# Patient Record
Sex: Female | Born: 2000 | Race: White | Hispanic: No | Marital: Single | Smoking: Never smoker
Health system: Southern US, Community
[De-identification: ages and names within clinical notes are randomized; demographics above are authoritative.]

## PROBLEM LIST (undated history)

## (undated) DIAGNOSIS — E079 Disorder of thyroid, unspecified: Secondary | ICD-10-CM

## (undated) DIAGNOSIS — K219 Gastro-esophageal reflux disease without esophagitis: Secondary | ICD-10-CM

## (undated) DIAGNOSIS — F32A Depression, unspecified: Secondary | ICD-10-CM

## (undated) DIAGNOSIS — J358 Other chronic diseases of tonsils and adenoids: Secondary | ICD-10-CM

## (undated) DIAGNOSIS — J382 Nodules of vocal cords: Secondary | ICD-10-CM

## (undated) DIAGNOSIS — D649 Anemia, unspecified: Secondary | ICD-10-CM

## (undated) DIAGNOSIS — F419 Anxiety disorder, unspecified: Secondary | ICD-10-CM

## (undated) HISTORY — PX: CHOLECYSTECTOMY: SHX55

## (undated) HISTORY — DX: Anemia, unspecified: D64.9

## (undated) HISTORY — DX: Anxiety disorder, unspecified: F41.9

## (undated) HISTORY — PX: NO PAST SURGERIES: SHX2092

## (undated) HISTORY — DX: Disorder of thyroid, unspecified: E07.9

## (undated) HISTORY — DX: Gastro-esophageal reflux disease without esophagitis: K21.9

## (undated) HISTORY — DX: Depression, unspecified: F32.A

---
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
## 2019-01-08 ENCOUNTER — Encounter: Payer: Self-pay | Admitting: Obstetrics and Gynecology

## 2019-01-08 ENCOUNTER — Other Ambulatory Visit: Payer: Self-pay

## 2019-01-08 ENCOUNTER — Ambulatory Visit (INDEPENDENT_AMBULATORY_CARE_PROVIDER_SITE_OTHER): Payer: 59 | Admitting: Obstetrics and Gynecology

## 2019-01-08 DIAGNOSIS — F329 Major depressive disorder, single episode, unspecified: Secondary | ICD-10-CM | POA: Diagnosis not present

## 2019-01-08 DIAGNOSIS — F419 Anxiety disorder, unspecified: Secondary | ICD-10-CM | POA: Diagnosis not present

## 2019-01-08 MED ORDER — ESCITALOPRAM OXALATE 20 MG PO TABS: 20.0000 mg | ORAL_TABLET | Freq: Every day | ORAL | 3 refills | Status: DC

## 2019-01-08 NOTE — Progress Notes (Signed)
Obstetrics & Gynecology Office Visit   Chief Complaint:  Chief Complaint  Patient presents with  . Follow-up    pt feels a lot more stressed now in school than when she was at home, doesn't feel like med is working    History of Present Illness: The patient is a 18 y.o. female presenting follow up for symptoms of anxiety and depression.  The patient is currently taking Lexapro 10mg  po daily and hydroxizine 25mg  po prn for the management of her symptoms.  She has had recent situational stressors.  Has started her fall semester of Sophomore year.  Currently hybrid model of in person and online education.  Good peer support network.  However, some of the extracurricular opportunities are no long available or have moved to [employer] like bible study.  Her boyfriend does not attend college with her, so also the stress of long distance relationship.  He does come visit on weekends.    She reports symptoms of anhedonia, day time somnolence, irritability, social anxiety and agorophobia.  She denies risk taking behavior, suicidal ideation, homicidal ideation, auditory hallucinations and visual hallucinations. Still a lot of anxiety and fears regarding driving, thunderstorms.  Symptoms have worsened since last visit.     The patient does have a pre-existing history of depression and anxiety.  She  does not a prior history of suicide attempts.    Review of Systems: Review of Systems  Constitutional: Negative.   Gastrointestinal: Negative for nausea.  Genitourinary: Negative.   Neurological: Negative for headaches.  Psychiatric/Behavioral: Positive for depression. Negative for hallucinations, memory loss, substance abuse and suicidal ideas. The patient is nervous/anxious and has insomnia.      Past Medical History:  Past Medical History:  Diagnosis Date  . Anemia     Past Surgical History:  History reviewed. No pertinent surgical history.  Gynecologic History: No LMP recorded.   Obstetric History: G0P0000  Family History:  Family History  Problem Relation Age of Onset  . Lung cancer Maternal Grandfather   . Breast cancer Paternal Grandmother 47       Malignant neoplasms, Estrogen Receptor positive    Social History:  Social History   Socioeconomic History  . Marital status: Single    Spouse name: Not on file  . Number of children: Not on file  . Years of education: Not on file  . Highest education level: Not on file  Occupational History  . Not on file  Social Needs  . Financial resource strain: Not on file  . Food insecurity    Worry: Not on file    Inability: Not on file  . Transportation needs    Medical: Not on file    Non-medical: Not on file  Tobacco Use  . Smoking status: Never Smoker  . Smokeless tobacco: Never Used  Substance and Sexual Activity  . Alcohol use: No  . Drug use: No  . Sexual activity: Never    Birth control/protection: None  Lifestyle  . Physical activity    Days per week: 0 days    Minutes per session: 0 min  . Stress: To some extent  Relationships  . Social connections    Talks on phone: More than three times a week    Gets together: More than three times a week    Attends religious service: More than 4 times per year    Active member of club or organization: Yes    Attends meetings of clubs or  organizations: More than 4 times per year    Relationship status: Never married  . Intimate partner violence    Fear of current or ex partner: No    Emotionally abused: No    Physically abused: No    Forced sexual activity: No  Other Topics Concern  . Not on file  Social History Narrative  . Not on file    Allergies:  No Known Allergies  Medications: Prior to Admission medications   Medication Sig Start Date End Date Taking? Authorizing Provider  escitalopram (LEXAPRO) 10 MG tablet Take 1 tablet (10 mg total) by mouth daily. 10/08/18 10/08/19 Yes Vena AustriaStaebler, Nazaret Chea, MD  hydrOXYzine (ATARAX/VISTARIL) 25 MG tablet  Take 1 tablet (25 mg total) by mouth every 6 (six) hours as needed for anxiety. 09/09/18  Yes Vena AustriaStaebler, Maisen Klingler, MD  norgestimate-ethinyl estradiol (ORTHO-CYCLEN) 0.25-35 MG-MCG tablet Take 1 tablet by mouth daily. Skip the placebo pill the last week of your pill pack and start a new pack of active pills 10/08/18  Yes Vena AustriaStaebler, Tayona Sarnowski, MD  ferrous gluconate (FERGON) 324 MG tablet Take 324 mg by mouth daily with breakfast.    [provider]    Physical Exam Vitals: There were no vitals filed for this visit. No LMP recorded.  No physical exam as this was a remote telephone visit to promote social distancing during the current COVID-19 Pandemic  GAD 7 : Generalized Anxiety Score 01/08/2019 10/08/2018 09/16/2018 09/09/2018  Nervous, Anxious, on Edge 3 1 1 2   Control/stop worrying 2 1 1 3   Worry too much - different things 3 1 3 3   Trouble relaxing 2 0 2 2  Restless 1 0 0 1  Easily annoyed or irritable 3 1 1 3   Afraid - awful might happen 2 1 2 3   Total GAD 7 Score 16 5 10 17   Anxiety Difficulty Somewhat difficult Somewhat difficult Somewhat difficult Somewhat difficult    [facility] 01/08/2019 10/08/2018 09/16/2018  Decreased Interest 3 1 1   Down, Depressed, Hopeless 2 1 0  PHQ - 2 Score 5 2 1   Altered sleeping 2 0 1  Tired, decreased energy 3 2 1   Change in appetite 3 1 2   Feeling bad or failure about yourself  2 1 2   Trouble concentrating 2 0 1  Moving slowly or fidgety/restless 1 0 0  Suicidal thoughts 0 0 0  PHQ-9 Score 18 6 8   Difficult doing work/chores Somewhat difficult Somewhat difficult Somewhat difficult    [facility] screen [facility] 01/08/2019 10/08/2018 09/16/2018 09/09/2018  Decreased Interest 3 1 1 2   Down, Depressed, Hopeless 2 1 0 2  PHQ - 2 Score 5 2 1 4   Altered sleeping 2 0 1 0  Tired, decreased energy 3 2 1  0  Change in appetite 3 1 2 3   Feeling bad or failure about yourself  2 1 2 3   Trouble concentrating 2 0 1 0  Moving slowly or fidgety/restless 1  0 0 0  Suicidal thoughts 0 0 0 0  PHQ-9 Score 18 6 8 10   Difficult doing work/chores Somewhat difficult Somewhat difficult Somewhat difficult Very difficult   Assessment: 18 y.o. G0P0000 follow up anxiety and depression  Plan: Problem List Items Addressed This Visit    None    Visit Diagnoses    Anxiety and depression    -  Primary      1) Anxiety and depression  - increase Lexparo to 20mg  - if no improvement noted with this  dose increase discussed switching agents. If partial response consider adding buspar or Wellbutrin.    2) Thyroid and B12 screen has been obtained previously  3) Time 14:43min  4) Vaginal spotting - missed a few pill in pill pack.  Doubled up the next day.  Monitor to see if persists  5) Return in about 2 weeks (around 01/22/2019) for medication follow .   Vena Austria, MD, [facility], [facility] 01/08/2019, 3:12 PM

## 2019-01-25 ENCOUNTER — Other Ambulatory Visit: Payer: Self-pay

## 2019-01-25 ENCOUNTER — Ambulatory Visit: Payer: 59 | Admitting: Obstetrics and Gynecology

## 2019-01-25 ENCOUNTER — Encounter: Payer: Self-pay | Admitting: Obstetrics and Gynecology

## 2019-01-25 DIAGNOSIS — F329 Major depressive disorder, single episode, unspecified: Secondary | ICD-10-CM | POA: Diagnosis not present

## 2019-01-25 DIAGNOSIS — N939 Abnormal uterine and vaginal bleeding, unspecified: Secondary | ICD-10-CM

## 2019-01-25 DIAGNOSIS — F419 Anxiety disorder, unspecified: Secondary | ICD-10-CM

## 2019-01-25 NOTE — Progress Notes (Signed)
I connected with Tina [name] on 01/25/19 at  3:10 PM EDT by telephone and verified that I am speaking with the correct person using two identifiers.   I discussed the limitations, risks, security and privacy concerns of performing an evaluation and management service by telephone and the availability of in person appointments. I also discussed with the patient that there may be a patient responsible charge related to this service. The patient expressed understanding and agreed to proceed.  The patient was at home I spoke with the patient from my workstation phone The names of people involved in this encounter were: Tina [name] , and [name]   Obstetrics & Gynecology Office Visit   Chief Complaint:  Chief Complaint  Patient presents with  . Follow-up    Medication    History of Present Illness: The patient is a 18 y.o. female presenting follow up for symptoms of anxiety and depression.  The patient is currently taking Lexapro with dose increase from 10mg  to 4539m 2 weeks ago for the management of her symptoms.  She has not had any recent situational stressors.  She reports symptoms of anxiety, irritability and social anxiety.  She denies anhedonia, risk taking behavior, social anxiety, agorophobia, feelings of guilt, feelings of worthlessness, suicidal ideation, homicidal ideation, auditory hallucinations and visual hallucinations. Symptoms have improved since last visit.     The patient does have a pre-existing history of depression and anxiety.  She  does not a prior history of suicide attempts.   She had noted some breakthrough bleeding on her current OCP.  This has continued.  Review of Systems: Review of Systems  Constitutional: Negative.   Genitourinary: Negative.   Psychiatric/Behavioral: Negative.      Past Medical History:  Past Medical History:  Diagnosis Date  . Anemia     Past Surgical History:  History reviewed. No pertinent surgical history.   Gynecologic History: No LMP recorded.  Obstetric History: G0P0000  Family History:  Family History  Problem Relation Age of Onset  . Lung cancer Maternal Grandfather   . Breast cancer Paternal Grandmother [phone]       Malignant neoplasms, Estrogen Receptor positive    Social History:  Social History   Socioeconomic History  . Marital status: Single    Spouse name: Not on file  . Number of children: Not on file  . Years of education: Not on file  . Highest education level: Not on file  Occupational History  . Not on file  Social Needs  . Financial resource strain: Not on file  . Food insecurity    Worry: Not on file    Inability: Not on file  . Transportation needs    Medical: Not on file    Non-medical: Not on file  Tobacco Use  . Smoking status: Never Smoker  . Smokeless tobacco: Never Used  Substance and Sexual Activity  . Alcohol use: No  . Drug use: No  . Sexual activity: Never    Birth control/protection: None  Lifestyle  . Physical activity    Days per week: 0 days    Minutes per session: 0 min  . Stress: To some extent  Relationships  . Social connections    Talks on phone: More than three times a week    Gets together: More than three times a week    Attends religious service: More than 4 times per year    Active member of club or organization: Yes  Attends meetings of clubs or organizations: More than 4 times per year    Relationship status: Never married  . Intimate partner violence    Fear of current or ex partner: No    Emotionally abused: No    Physically abused: No    Forced sexual activity: No  Other Topics Concern  . Not on file  Social History Narrative  . Not on file    Allergies:  No Known Allergies  Medications: Prior to Admission medications   Medication Sig Start Date End Date Taking? Authorizing Provider  escitalopram (LEXAPRO) 20 MG tablet Take 1 tablet (20 mg total) by mouth daily. 01/08/19  Yes Vena Austria, MD  ferrous  gluconate (FERGON) 324 MG tablet Take 324 mg by mouth daily with breakfast.   Yes [provider]  hydrOXYzine (ATARAX/VISTARIL) 25 MG tablet Take 1 tablet (25 mg total) by mouth every 6 (six) hours as needed for anxiety. 09/09/18  Yes Vena Austria, MD  norgestimate-ethinyl estradiol (ORTHO-CYCLEN) 0.25-35 MG-MCG tablet Take 1 tablet by mouth daily. Skip the placebo pill the last week of your pill pack and start a new pack of active pills 10/08/18  Yes Vena Austria, MD    Physical Exam Vitals: There were no vitals filed for this visit. No LMP recorded.  No physical exam as this was a remote telephone visit to promote social distancing during the current COVID-19 Pandemic   GAD 7 : Generalized Anxiety Score 01/25/2019 01/08/2019 10/08/2018 09/16/2018  Nervous, Anxious, on Edge 2 3 1 1   Control/stop worrying 1 2 1 1   Worry too much - different things 1 3 1 3   Trouble relaxing 0 2 0 2  Restless 0 1 0 0  Easily annoyed or irritable 2 3 1 1   Afraid - awful might happen 1 2 1 2   Total GAD 7 Score 7 16 5 10   Anxiety Difficulty Somewhat difficult Somewhat difficult Somewhat difficult Somewhat difficult    Depression screen [facility] 2/9 01/25/2019 01/08/2019 10/08/2018  Decreased Interest 2 3 1   Down, Depressed, Hopeless 1 2 1   PHQ - 2 Score 3 5 2   Altered sleeping 1 2 0  Tired, decreased energy 1 3 2   Change in appetite 1 3 1   Feeling bad or failure about yourself  1 2 1   Trouble concentrating 1 2 0  Moving slowly or fidgety/restless 0 1 0  Suicidal thoughts 0 0 0  PHQ-9 Score 8 18 6   Difficult doing work/chores Somewhat difficult Somewhat difficult Somewhat difficult    Depression screen [facility] 2/9 01/25/2019 01/08/2019 10/08/2018 09/16/2018 09/09/2018  Decreased Interest 2 3 1 1 2   Down, Depressed, Hopeless 1 2 1  0 2  PHQ - 2 Score 3 5 2 1 4   Altered sleeping 1 2 0 1 0  Tired, decreased energy 1 3 2 1  0  Change in appetite 1 3 1 2 3   Feeling bad or failure about yourself  1 2 1 2 3    Trouble concentrating 1 2 0 1 0  Moving slowly or fidgety/restless 0 1 0 0 0  Suicidal thoughts 0 0 0 0 0  PHQ-9 Score 8 18 6 8 10   Difficult doing work/chores Somewhat difficult Somewhat difficult Somewhat difficult Somewhat difficult Very difficult     Assessment: 18 y.o. G0P0000 follow up anxiety and depression, and AUB on OCP  Plan: Problem List Items Addressed This Visit    None    Visit Diagnoses    Abnormal uterine bleeding    -  Primary   Relevant Orders   US Pelvis Complete   Anxiety and depression          1) Anxiety/Depression - good response to dose increase in lexapro.  Will re-evalute before finals in November  2) Thyroid and B12 screen has been obtained previously  3) AUB - still having breakthrough bleeding on sprintec.  Taking as directed no missed pills.  - obtain transabdominal ultrasound to evaluate uterine lining - labs at that time if normal ultrasound - consider changing contraceptive therapy following final exams if normal work up  4) Telephone time 8:25 minutes  5) No follow-ups on file.    Malachy Mood, MD, [facility], [facility] 01/25/2019, 3:29 PM

## 2019-02-23 ENCOUNTER — Other Ambulatory Visit: Payer: Self-pay | Admitting: Obstetrics and Gynecology

## 2019-02-23 ENCOUNTER — Telehealth: Payer: Self-pay | Admitting: Obstetrics and Gynecology

## 2019-02-23 MED ORDER — ESCITALOPRAM OXALATE 20 MG PO TABS: 20.0000 mg | ORAL_TABLET | Freq: Every day | ORAL | 3 refills | Status: DC

## 2019-02-23 NOTE — Telephone Encounter (Signed)
Rx has been sent  

## 2019-02-23 NOTE — Telephone Encounter (Signed)
Pt needs refill on lexapro.  Please send to [facility]  [phone]  [place] [id number]

## 2019-03-30 ENCOUNTER — Ambulatory Visit: Payer: 59 | Admitting: Obstetrics and Gynecology

## 2019-03-30 ENCOUNTER — Ambulatory Visit: Payer: 59

## 2019-03-30 ENCOUNTER — Other Ambulatory Visit: Payer: 59

## 2019-04-20 ENCOUNTER — Encounter: Payer: Self-pay | Admitting: Obstetrics and Gynecology

## 2019-04-20 ENCOUNTER — Ambulatory Visit (INDEPENDENT_AMBULATORY_CARE_PROVIDER_SITE_OTHER): Payer: 59 | Admitting: Obstetrics and Gynecology

## 2019-04-20 ENCOUNTER — Ambulatory Visit (INDEPENDENT_AMBULATORY_CARE_PROVIDER_SITE_OTHER): Payer: 59

## 2019-04-20 ENCOUNTER — Other Ambulatory Visit: Payer: Self-pay

## 2019-04-20 VITALS — BP 130/80 | Ht 61.0 in | Wt 235.0 lb

## 2019-04-20 DIAGNOSIS — N939 Abnormal uterine and vaginal bleeding, unspecified: Secondary | ICD-10-CM | POA: Diagnosis not present

## 2019-04-20 DIAGNOSIS — Z3041 Encounter for surveillance of contraceptive pills: Secondary | ICD-10-CM

## 2019-04-20 MED ORDER — ESCITALOPRAM OXALATE 20 MG PO TABS: 20.0000 mg | ORAL_TABLET | Freq: Every day | ORAL | 3 refills | Status: DC

## 2019-04-20 MED ORDER — LEVONORGEST-ETH EST & ETH EST 42-21-21-7 DAYS PO TABS: 1.0000 | ORAL_TABLET | Freq: Every day | ORAL | 3 refills | Status: DC

## 2019-04-22 NOTE — Progress Notes (Signed)
Gynecology Ultrasound Follow Up  Chief Complaint:  Chief Complaint  Patient presents with  . Follow-up    u/s  . Contraception    interested in switching BC, interested in Nexplanon     History of Present Illness: Patient is a 18 y.o. female who presents today for ultrasound evaluation of AUB.  Ultrasound demonstrates the following findgins Adnexa: no masses seen some indication of possible PCOS but not clearly imaged Uterus: Non-enlarged with endometrial stripe non-thickened and homogenous without focal abnormalities Additional: no free fluid  Interested in nexplanon but we discussed that this may not be an ideal choice given high association with irregular bleeding as well as worsening of anxiety/depression secondary to high dose progestin effect.    Review of Systems: Review of Systems  Constitutional: Negative.   Gastrointestinal: Negative for nausea.  Psychiatric/Behavioral: Negative.     Past Medical History:  Past Medical History:  Diagnosis Date  . Anemia     Past Surgical History:  History reviewed. No pertinent surgical history.  Gynecologic History:  Patient's last menstrual period was 03/15/2019 (approximate). Family History:  Family History  Problem Relation Age of Onset  . Lung cancer Maternal Grandfather   . Breast cancer Paternal Grandmother 45       Malignant neoplasms, Estrogen Receptor positive    Social History:  Social History   Socioeconomic History  . Marital status: Single    Spouse name: Not on file  . Number of children: Not on file  . Years of education: Not on file  . Highest education level: Not on file  Occupational History  . Not on file  Tobacco Use  . Smoking status: Never Smoker  . Smokeless tobacco: Never Used  Substance and Sexual Activity  . Alcohol use: No  . Drug use: No  . Sexual activity: Never    Birth control/protection: None  Other Topics Concern  . Not on file  Social History Narrative  . Not on file    Social Determinants of Health   Financial Resource Strain:   . Difficulty of Paying Living Expenses: Not on file  Food Insecurity:   . Worried About Programme researcher, broadcasting/film/video in the Last Year: Not on file  . Ran Out of Food in the Last Year: Not on file  Transportation Needs:   . Lack of Transportation (Medical): Not on file  . Lack of Transportation (Non-Medical): Not on file  Physical Activity:   . Days of Exercise per Week: Not on file  . Minutes of Exercise per Session: Not on file  Stress:   . Feeling of Stress : Not on file  Social Connections:   . Frequency of Communication with Friends and Family: Not on file  . Frequency of Social Gatherings with Friends and Family: Not on file  . Attends Religious Services: Not on file  . Active Member of Clubs or Organizations: Not on file  . Attends Banker Meetings: Not on file  . Marital Status: Not on file  Intimate Partner Violence:   . Fear of Current or Ex-Partner: Not on file  . Emotionally Abused: Not on file  . Physically Abused: Not on file  . Sexually Abused: Not on file    Allergies:  No Known Allergies  Medications: Prior to Admission medications   Medication Sig Start Date End Date Taking? Authorizing Provider  escitalopram (LEXAPRO) 20 MG tablet Take 1 tablet (20 mg total) by mouth daily. 04/20/19  Yes Vena Austria, MD  norgestimate-ethinyl estradiol (ORTHO-CYCLEN) 0.25-35 MG-MCG tablet Take 1 tablet by mouth daily. Skip the placebo pill the last week of your pill pack and start a new pack of active pills 10/08/18  Yes Malachy Mood, MD  hydrOXYzine (ATARAX/VISTARIL) 25 MG tablet Take 1 tablet (25 mg total) by mouth every 6 (six) hours as needed for anxiety. Patient not taking: Reported on 04/20/2019 09/09/18   Malachy Mood, MD  Levonorgest-Eth Est & Eth Est Terence Lux) 42-21-21-7 DAYS TABS Take 1 tablet by mouth daily. 04/20/19 07/20/19  Malachy Mood, MD    Physical Exam Vitals: Blood  pressure 130/80, height 5\' 1"  (1.549 m), weight 235 lb (106.6 kg), last menstrual period 03/15/2019.  General: NAD HEENT: normocephalic, anicteric Pulmonary: No increased work of breathing Extremities: no edema, erythema, or tenderness Neurologic: Grossly intact, normal gait Psychiatric: mood appropriate, affect full  US Pelvis Complete  Result Date: 04/22/2019 Patient Name: Tina [name] DOB: 18-Apr-2001 MRN: [id number] ULTRASOUND REPORT Location: [facility] Date of Service: 04/20/2019 Indications:Abnormal Uterine Bleeding Findings: Transabdominal only. The uterus is anteverted and measures 6.7 x 3.8 x 3.0cm. Echo texture is homogenous without evidence of focal masses. The Endometrium appears thin and smooth measuring 4.1 mm. Right Ovary measures 2.8 x 1.3 x 1.1 cm. It is normal in appearance. Left Ovary measures 2.5 x 1.4 x 1.2 cm. It is normal in appearance. Ovaries appear grossly normal. Unable to clearly visualize follicles to rule out PCOS. Survey of the adnexa demonstrates no adnexal masses. There is no free fluid in the cul de sac. Impression: 1. Grossly normal pelvic ultrasound. Recommendations: 1.Clinical correlation with the patient's History and Physical Exam. Vita Barley, RT Images reviewed.  Normal GYN study without visualized pathology.  Malachy Mood, MD, [facility], [facility]   Assessment: 18 y.o. G0P0000 follow up AUB, anxiety Plan: Problem List Items Addressed This Visit    None    Visit Diagnoses    Abnormal uterine bleeding    -  Primary   Relevant Orders   PCOS panel (TSH+PRL+FSH+TESTT+LH+DHEA S...)   Encounter for surveillance of contraceptive pills          1) AUB-O - suspect underlying component of PCOS,  [place] suggestive but images of ovaries were not particularly clear.  Will switch to [place] for OCP  2) Doing well on Lexapro GAD-7 is 7, PHQ-9 is 8  3) A total of 15 minutes were spent in face-to-face contact with the  patient during this encounter with over half of that time devoted to counseling and coordination of care.  4) Return in about 3 months (around 07/19/2019) for medication follow up.    Malachy Mood, MD, [facility], [facility] 04/22/2019, 11:48 AM

## 2019-04-30 LAB — TSH+PRL+FSH+TESTT+LH+DHEA S...
17-Hydroxyprogesterone: <10 ng/dL
Androstenedione: 45 ng/dL (ref 41–262)
DHEA-SO4: 127 ug/dL (ref 110.0–433.2)
FSH: 2.2 m[IU]/mL
LH: 1.7 m[IU]/mL
Prolactin: 13.2 ng/mL (ref 4.8–23.3)
TSH: 4.05 u[IU]/mL (ref 0.450–4.500)
Testosterone, Free: 3.2 pg/mL
Testosterone: 41 ng/dL

## 2019-06-16 ENCOUNTER — Ambulatory Visit: Payer: 59 | Attending: Internal Medicine

## 2019-06-16 DIAGNOSIS — Z20822 Contact with and (suspected) exposure to covid-19: Secondary | ICD-10-CM | POA: Insufficient documentation

## 2019-06-17 LAB — NOVEL CORONAVIRUS, NAA: SARS-CoV-2, NAA: NOT DETECTED

## 2019-07-27 ENCOUNTER — Telehealth: Payer: Self-pay | Admitting: Obstetrics and Gynecology

## 2019-07-27 ENCOUNTER — Other Ambulatory Visit: Payer: Self-pay | Admitting: Obstetrics and Gynecology

## 2019-07-27 MED ORDER — LEVONORGEST-ETH EST & ETH EST 42-21-21-7 DAYS PO TABS: 1.0000 | ORAL_TABLET | Freq: Every day | ORAL | 3 refills | Status: DC

## 2019-07-27 NOTE — Telephone Encounter (Signed)
Rx has been sent  

## 2019-07-27 NOTE — Telephone Encounter (Signed)
Patient needs refill on birth control.  Appt scheduled 4/12 for follow up.  [facility] [address]

## 2019-08-16 ENCOUNTER — Ambulatory Visit (INDEPENDENT_AMBULATORY_CARE_PROVIDER_SITE_OTHER): Payer: 59 | Admitting: Obstetrics and Gynecology

## 2019-08-16 ENCOUNTER — Other Ambulatory Visit: Payer: Self-pay

## 2019-08-16 ENCOUNTER — Ambulatory Visit: Payer: 59 | Admitting: Obstetrics and Gynecology

## 2019-08-16 ENCOUNTER — Encounter: Payer: Self-pay | Admitting: Obstetrics and Gynecology

## 2019-08-16 VITALS — Ht 61.0 in | Wt 230.0 lb

## 2019-08-16 DIAGNOSIS — Z3041 Encounter for surveillance of contraceptive pills: Secondary | ICD-10-CM

## 2019-08-16 DIAGNOSIS — F419 Anxiety disorder, unspecified: Secondary | ICD-10-CM | POA: Diagnosis not present

## 2019-08-16 DIAGNOSIS — F329 Major depressive disorder, single episode, unspecified: Secondary | ICD-10-CM

## 2019-08-16 MED ORDER — ESCITALOPRAM OXALATE 20 MG PO TABS: 20.0000 mg | ORAL_TABLET | Freq: Every day | ORAL | 3 refills | Status: DC

## 2019-08-16 NOTE — Progress Notes (Signed)
I connected with Tina [name] on 08/16/19 at  2:10 PM EDT by telephone and verified that I am speaking with the correct person using two identifiers.   I discussed the limitations, risks, security and privacy concerns of performing an evaluation and management service by telephone and the availability of in person appointments. I also discussed with the patient that there may be a patient responsible charge related to this service. The patient expressed understanding and agreed to proceed.  The patient was at home I spoke with the patient from my workstation phone The names of people involved in this encounter were: Tina [name] , and [facility] Office Visit   Chief Complaint:  Chief Complaint  Patient presents with  . Follow-up    History of Present Illness: The patient is a 19 y.o. female presenting follow up for symptoms of anxiety and depression.  The patient is currently taking Lexapro 20mg  for the management of her symptoms, has not needed hydralazine prn.  She has not had any recent situational stressors.  She continues to report good control of symptoms.  She denies anhedonia, day time somnolence, insomnia, risk taking behavior, irritability, social anxiety, agorophobia, feelings of guilt, feelings of worthlessness, suicidal ideation, homicidal ideation, auditory hallucinations and visual hallucinations. Symptoms have remained unchanged since last visit.     The patient does have a pre-existing history of depression and anxiety.  She  does not a prior history of suicide attempts.    In addition the patient has done well since switching OCP to quartet.  She has not had problems with breakthrough bleeding and reports normal menstrual flow at the end of her first pill pack.  Review of Systems: review of systems negative unless noted in HPI  Past Medical History:  Past Medical History:  Diagnosis Date  . Anemia     Past Surgical History:    No past surgical history on file.  Gynecologic History: Patient's last menstrual period was 07/29/2019.  Obstetric History: G0P0000  Family History:  Family History  Problem Relation Age of Onset  . Lung cancer Maternal Grandfather   . Breast cancer Paternal Grandmother 59       Malignant neoplasms, Estrogen Receptor positive    Social History:  Social History   Socioeconomic History  . Marital status: Single    Spouse name: Not on file  . Number of children: Not on file  . Years of education: Not on file  . Highest education level: Not on file  Occupational History  . Not on file  Tobacco Use  . Smoking status: Never Smoker  . Smokeless tobacco: Never Used  Substance and Sexual Activity  . Alcohol use: No  . Drug use: No  . Sexual activity: Never    Birth control/protection: None  Other Topics Concern  . Not on file  Social History Narrative  . Not on file   Social Determinants of Health   Financial Resource Strain:   . Difficulty of Paying Living Expenses:   Food Insecurity:   . Worried About Charity fundraiser in the Last Year:   . Arboriculturist in the Last Year:   Transportation Needs:   . Film/video editor (Medical):   Marland Kitchen Lack of Transportation (Non-Medical):   Physical Activity:   . Days of Exercise per Week:   . Minutes of Exercise per Session:   Stress:   . Feeling of Stress :   Social  Connections:   . Frequency of Communication with Friends and Family:   . Frequency of Social Gatherings with Friends and Family:   . Attends Religious Services:   . Active Member of Clubs or Organizations:   . Attends Banker Meetings:   Marland Kitchen Marital Status:   Intimate Partner Violence:   . Fear of Current or Ex-Partner:   . Emotionally Abused:   Marland Kitchen Physically Abused:   . Sexually Abused:     Allergies:  No Known Allergies  Medications: Prior to Admission medications   Medication Sig Start Date End Date Taking? Authorizing Provider   escitalopram (LEXAPRO) 20 MG tablet Take 1 tablet (20 mg total) by mouth daily. 04/20/19  Yes Vena Austria, MD  hydrOXYzine (ATARAX/VISTARIL) 25 MG tablet Take 1 tablet (25 mg total) by mouth every 6 (six) hours as needed for anxiety. 09/09/18  Yes Vena Austria, MD  Levonorgest-Eth Est & Eth Est Nilda Calamity) 42-21-21-7 DAYS TABS Take 1 tablet by mouth daily. 07/27/19 10/26/19 Yes Vena Austria, MD    Physical Exam Vitals: There were no vitals filed for this visit. Patient's last menstrual period was 07/29/2019.  No physical exam as this was a remote telephone visit to promote social distancing during the current COVID-19 Pandemic   GAD 7 : Generalized Anxiety Score 08/16/2019 04/20/2019 01/25/2019 01/08/2019  Nervous, Anxious, on Edge 1 1 2 3   Control/stop worrying 0 0 1 2  Worry too much - different things 2 1 1 3   Trouble relaxing 1 0 0 2  Restless 0 0 0 1  Easily annoyed or irritable 1 2 2 3   Afraid - awful might happen 1 1 1 2   Total GAD 7 Score 6 5 7 16   Anxiety Difficulty Not difficult at all Somewhat difficult Somewhat difficult Somewhat difficult    Depression screen [facility] 2/9 08/16/2019 04/20/2019 01/25/2019  Decreased Interest 1 1 2   Down, Depressed, Hopeless 1 1 1   PHQ - 2 Score 2 2 3   Altered sleeping 1 0 1  Tired, decreased energy 3 3 1   Change in appetite 3 1 1   Feeling bad or failure about yourself  1 1 1   Trouble concentrating 0 0 1  Moving slowly or fidgety/restless 0 0 0  Suicidal thoughts 0 0 0  PHQ-9 Score 10 7 8   Difficult doing work/chores Not difficult at all Somewhat difficult Somewhat difficult    Depression screen [facility] 2/9 08/16/2019 04/20/2019 01/25/2019 01/08/2019 10/08/2018  Decreased Interest 1 1 2 3 1   Down, Depressed, Hopeless 1 1 1 2 1   PHQ - 2 Score 2 2 3 5 2   Altered sleeping 1 0 1 2 0  Tired, decreased energy 3 3 1 3 2   Change in appetite 3 1 1 3 1   Feeling bad or failure about yourself  1 1 1 2 1   Trouble concentrating 0 0 1 2 0  Moving  slowly or fidgety/restless 0 0 0 1 0  Suicidal thoughts 0 0 0 0 0  PHQ-9 Score 10 7 8 18 6   Difficult doing work/chores Not difficult at all Somewhat difficult Somewhat difficult Somewhat difficult Somewhat difficult     Assessment: 19 y.o. G0P0000 follow up anxiety/depression as well as AUB  Plan: Problem List Items Addressed This Visit    None    Visit Diagnoses    Encounter for surveillance of contraceptive pills    -  Primary   Anxiety and depression       Relevant Medications  escitalopram (LEXAPRO) 20 MG tablet      1) Continue Lexparo and continue quartette  2) Thyroid and B12 screen has been obtained previously  3) TelephoneTime 5:45 min  4) Return in about 9 months (around 05/17/2020) for medication follow up.   Vena Austria, MD, [facility], [facility] 08/16/2019, 1:37 PM

## 2019-09-13 ENCOUNTER — Other Ambulatory Visit: Payer: Self-pay

## 2019-09-13 ENCOUNTER — Encounter: Payer: Self-pay | Admitting: Obstetrics

## 2019-09-13 ENCOUNTER — Ambulatory Visit (INDEPENDENT_AMBULATORY_CARE_PROVIDER_SITE_OTHER): Payer: 59 | Admitting: Obstetrics

## 2019-09-13 VITALS — BP 120/80 | Ht 61.0 in | Wt 240.0 lb

## 2019-09-13 DIAGNOSIS — Z0189 Encounter for other specified special examinations: Secondary | ICD-10-CM

## 2019-09-13 DIAGNOSIS — R350 Frequency of micturition: Secondary | ICD-10-CM

## 2019-09-13 DIAGNOSIS — R300 Dysuria: Secondary | ICD-10-CM | POA: Diagnosis not present

## 2019-09-13 DIAGNOSIS — R3915 Urgency of urination: Secondary | ICD-10-CM | POA: Insufficient documentation

## 2019-09-13 LAB — POCT URINALYSIS DIPSTICK
Glucose, UA: NEGATIVE
Ketones, UA: NEGATIVE
Leukocytes, UA: NEGATIVE
Nitrite, UA: NEGATIVE
Protein, UA: NEGATIVE
Spec Grav, UA: 1.01 (ref 1.010–1.025)
pH, UA: 7 (ref 5.0–8.0)

## 2019-09-13 NOTE — Progress Notes (Signed)
Obstetrics & Gynecology Office Visit   Chief Complaint:  Chief Complaint  Patient presents with  . 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 Today, she denies these sxs, but wanted to be evaluate for a UTI. Per her report, once she noticed the onset of sxs last week, she started taking AZO. The urgency ceased as well as any dysuria, however she decided to be seen today to formally  r/o a UTI. She denies any other gyn sxs, including irregular bleeding or pelvic pain.   Review of Systems:  ROS  Performed Hx is significant for treatment of mild anxiety/depression well managed with Lexapro.  Past Medical History:  Past Medical History:  Diagnosis Date  . Anemia     Past Surgical History:  History reviewed. No pertinent surgical history.  Gynecologic History: Patient's last menstrual period was 08/04/2019.  Obstetric History: G0P0000  Family History:  Family History  Problem Relation Age of Onset  . Lung cancer Maternal Grandfather   . Breast cancer Paternal Grandmother 29       Malignant neoplasms, Estrogen Receptor positive    Social History:  Social History   Socioeconomic History  . Marital status: Single    Spouse name: Not on file  . Number of children: Not on file  . Years of education: Not on file  . Highest education level: Not on file  Occupational History  . Not on file  Tobacco Use  . Smoking status: Never Smoker  . Smokeless tobacco: Never Used  Substance and Sexual Activity  . Alcohol use: No  . Drug use: No  . Sexual activity: Never    Birth control/protection: None  Other Topics Concern  . Not on file  Social History Narrative  . Not on file   Social Determinants of Health   Financial Resource Strain:   . Difficulty of Paying Living Expenses:   Food Insecurity:   . Worried About Charity fundraiser in the  Last Year:   . Arboriculturist in the Last Year:   Transportation Needs:   . Film/video editor (Medical):   Marland Kitchen Lack of Transportation (Non-Medical):   Physical Activity:   . Days of Exercise per Week:   . Minutes of Exercise per Session:   Stress:   . Feeling of Stress :   Social Connections:   . Frequency of Communication with Friends and Family:   . Frequency of Social Gatherings with Friends and Family:   . Attends Religious Services:   . Active Member of Clubs or Organizations:   . Attends Archivist Meetings:   Marland Kitchen Marital Status:   Intimate Partner Violence:   . Fear of Current or Ex-Partner:   . Emotionally Abused:   Marland Kitchen Physically Abused:   . Sexually Abused:     Allergies:  No Known Allergies  Medications: Prior to Admission medications   Medication Sig Start Date End Date Taking? Authorizing Provider  escitalopram (LEXAPRO) 20 MG tablet Take 1 tablet (20 mg total) by mouth daily. 08/16/19   Malachy Mood, MD  hydrOXYzine (ATARAX/VISTARIL) 25 MG tablet Take 1 tablet (25 mg total) by mouth every 6 (six) hours as needed for anxiety. 09/09/18   Malachy Mood, MD  Levonorgest-Eth Est & Eth Est Terence Lux) 42-21-21-7 DAYS TABS Take 1 tablet by mouth daily. 07/27/19 10/26/19  Malachy Mood,  MD    Physical Exam Vitals:  Vitals:   09/13/19 1358  BP: 120/80   Patient's last menstrual period was 08/04/2019.  Physical Exam  Reserved for lab testing. And consultation. Urine dip reveals neg Leukocyte esterace, neg for nitrites, WBCs, blood.   Assessment: 19 y.o. G0P0000 No problem-specific Assessment & Plan notes found for this encounter.   Plan: Problem List Items Addressed This Visit      Other   Dysuria   Urinary urgency    Other Visit Diagnoses    Frequent urination    -  Primary   Relevant Orders   POCT urinalysis dipstick (Completed)   Laboratory examination       Relevant Orders   POCT urinalysis dipstick (Completed)      Reassurance regarding her successful self treatment. Advised to increase H2O intake, and restart with AZO should her sxs reappear. Proper hygiene related to urination also reviewed.

## 2019-09-13 NOTE — Patient Instructions (Signed)

## 2019-12-14 ENCOUNTER — Other Ambulatory Visit: Payer: Self-pay | Admitting: Obstetrics and Gynecology

## 2019-12-14 NOTE — Telephone Encounter (Signed)
Pt has an appointment 8/11. Please advise if refill is appropriate

## 2019-12-15 ENCOUNTER — Other Ambulatory Visit: Payer: Self-pay

## 2019-12-15 ENCOUNTER — Encounter: Payer: Self-pay | Admitting: Obstetrics and Gynecology

## 2019-12-15 ENCOUNTER — Ambulatory Visit (INDEPENDENT_AMBULATORY_CARE_PROVIDER_SITE_OTHER): Payer: 59 | Admitting: Obstetrics and Gynecology

## 2019-12-15 VITALS — Ht 61.0 in | Wt 248.0 lb

## 2019-12-15 DIAGNOSIS — Z6841 Body Mass Index (BMI) 40.0 and over, adult: Secondary | ICD-10-CM | POA: Diagnosis not present

## 2019-12-15 MED ORDER — PHENTERMINE HCL 37.5 MG PO TABS: 37.5000 mg | ORAL_TABLET | Freq: Every day | ORAL | 0 refills | Status: DC

## 2019-12-15 NOTE — Progress Notes (Signed)
Patient verified by two Identifiers (DOB & ADDRESS).

## 2019-12-15 NOTE — Progress Notes (Signed)
I connected with Tina [name] on 12/15/19 at  1:15 PM EDT by telephone and verified that I am speaking with the correct person using two identifiers.   I discussed the limitations, risks, security and privacy concerns of performing an evaluation and management service by telephone and the availability of in person appointments. I also discussed with the patient that there may be a patient responsible charge related to this service. The patient expressed understanding and agreed to proceed.  The patient was at home I spoke with the patient from my workstation phone The names of people involved in this encounter were: Tina [name] , and Vena Austria  Gynecology Office Visit  Chief Complaint:  Chief Complaint  Patient presents with  . Weight Loss    History of Present Illness: Patientis a 19 y.o. G0P0000 female, who presents for the evaluation of weight gain. She has gained 13 pounds primarily over 8 months. The patient states the following issues have contributed to her weight problem: decreased physical activity, dietary choices The patient has no additional symptoms. The patient specifically denies memory loss, muscle weakness, excessive thirst, and polyuria. Weight related co-morbidities include none. The patient's past medical history is notable for none. She has not tried medical weight loss interventions in the past.  Review of Systems: 10 point review of systems negative unless otherwise noted in HPI  Past Medical History:  Patient Active Problem List   Diagnosis Date Noted  . Dysuria 09/13/2019  . Urinary urgency 09/13/2019    Past Surgical History:  History reviewed. No pertinent surgical history.  Gynecologic History: Patient's last menstrual period was 10/26/2019 (approximate).  Obstetric History: G0P0000  Family History:  Family History  Problem Relation Age of Onset  . Lung cancer Maternal Grandfather   . Breast cancer Paternal Grandmother 10        Malignant neoplasms, Estrogen Receptor positive    Social History:  Social History   Socioeconomic History  . Marital status: Single    Spouse name: Not on file  . Number of children: Not on file  . Years of education: Not on file  . Highest education level: Not on file  Occupational History  . Not on file  Tobacco Use  . Smoking status: Never Smoker  . Smokeless tobacco: Never Used  Vaping Use  . Vaping Use: Never used  Substance and Sexual Activity  . Alcohol use: No  . Drug use: No  . Sexual activity: Never    Birth control/protection: None  Other Topics Concern  . Not on file  Social History Narrative  . Not on file   Social Determinants of Health   Financial Resource Strain:   . Difficulty of Paying Living Expenses:   Food Insecurity:   . Worried About Programme researcher, broadcasting/film/video in the Last Year:   . Barista in the Last Year:   Transportation Needs:   . Freight forwarder (Medical):   Marland Kitchen Lack of Transportation (Non-Medical):   Physical Activity:   . Days of Exercise per Week:   . Minutes of Exercise per Session:   Stress:   . Feeling of Stress :   Social Connections:   . Frequency of Communication with Friends and Family:   . Frequency of Social Gatherings with Friends and Family:   . Attends Religious Services:   . Active Member of Clubs or Organizations:   . Attends Banker Meetings:   Marland Kitchen Marital Status:   Intimate Programme researcher, broadcasting/film/video  Violence:   . Fear of Current or Ex-Partner:   . Emotionally Abused:   Marland Kitchen Physically Abused:   . Sexually Abused:     Allergies:  No Known Allergies  Medications: Prior to Admission medications   Medication Sig Start Date End Date Taking? Authorizing Provider  escitalopram (LEXAPRO) 20 MG tablet TAKE 1 TABLET BY MOUTH EVERY DAY 12/14/19  Yes Vena Austria, MD  hydrOXYzine (ATARAX/VISTARIL) 25 MG tablet Take 1 tablet (25 mg total) by mouth every 6 (six) hours as needed for anxiety. 09/09/18  Yes Vena Austria, MD  Levonorgest-Eth Est & Eth Est Nilda Calamity) 42-21-21-7 DAYS TABS Take 1 tablet by mouth daily. 07/27/19 10/26/19  Vena Austria, MD    Physical Exam Height 5\' 1"  (1.549 m), weight 248 lb (112.5 kg), last menstrual period 10/26/2019. Patient's last menstrual period was 10/26/2019 (approximate). Body mass index is 46.86 kg/m.  No physical exam as this was a remote telephone visit to promote social distancing during the current COVID-19 Pandemic   Assessment: 19 y.o. G0P0000 presenting for discussion of weight loss management options  Plan: Problem List Items Addressed This Visit    None    Visit Diagnoses    Class 3 severe obesity without serious comorbidity with body mass index (BMI) of 45.0 to 49.9 in adult, unspecified obesity type (HCC)    -  Primary   Relevant Medications   phentermine (ADIPEX-P) 37.5 MG tablet      1) 1500 Calorie ADA Diet  2) Patient education given regarding appropriate lifestyle changes for weight loss including: regular physical activity, healthy coping strategies, caloric restriction and healthy eating patterns. Rx phentermine written  3) Goal weight <200lbs, ideally to 180lbs  4) Comorbidity Screening - hypothyroidism screening previously obtained  5) Encouraged weekly weight monitorig to track progress and sample 1 week food diary  6) Telephone time 18:35 min  7) Return in about 4 weeks (around 01/12/2020) for medication follow up phone.   03/13/2020, MD, Vena Austria [facility], [facility] 12/15/2019, 1:36 PM

## 2019-12-27 ENCOUNTER — Ambulatory Visit: Payer: 59 | Admitting: Obstetrics and Gynecology

## 2020-01-31 ENCOUNTER — Other Ambulatory Visit: Payer: Self-pay | Admitting: Obstetrics and Gynecology

## 2020-01-31 ENCOUNTER — Telehealth: Payer: Self-pay

## 2020-01-31 MED ORDER — LEVONORGEST-ETH EST & ETH EST 42-21-21-7 DAYS PO TABS: 1.0000 | ORAL_TABLET | Freq: Every day | ORAL | 3 refills | Status: DC

## 2020-01-31 MED ORDER — PHENTERMINE HCL 37.5 MG PO TABS: 37.5000 mg | ORAL_TABLET | Freq: Every day | ORAL | 0 refills | Status: DC

## 2020-01-31 MED ORDER — ESCITALOPRAM OXALATE 20 MG PO TABS: 20.0000 mg | ORAL_TABLET | Freq: Every day | ORAL | 4 refills | Status: DC

## 2020-01-31 NOTE — Telephone Encounter (Signed)
Sent!

## 2020-01-31 NOTE — Telephone Encounter (Signed)
Pt has a follow up appointment on 10/ 11/2019. Please send the birth control, Lexpro, Phentermine and Vistaril to [facility] in [place].

## 2020-02-02 ENCOUNTER — Other Ambulatory Visit (INDEPENDENT_AMBULATORY_CARE_PROVIDER_SITE_OTHER): Payer: 59 | Admitting: Obstetrics and Gynecology

## 2020-02-02 DIAGNOSIS — Z6841 Body Mass Index (BMI) 40.0 and over, adult: Secondary | ICD-10-CM | POA: Diagnosis not present

## 2020-02-02 MED ORDER — ESCITALOPRAM OXALATE 20 MG PO TABS: 20.0000 mg | ORAL_TABLET | Freq: Every day | ORAL | 4 refills | Status: DC

## 2020-02-02 MED ORDER — LEVONORGEST-ETH EST & ETH EST 42-21-21-7 DAYS PO TABS: 1.0000 | ORAL_TABLET | Freq: Every day | ORAL | 3 refills | Status: DC

## 2020-02-02 MED ORDER — PHENTERMINE HCL 37.5 MG PO TABS: 37.5000 mg | ORAL_TABLET | Freq: Every day | ORAL | 0 refills | Status: DC

## 2020-02-02 MED ORDER — HYDROXYZINE HCL 25 MG PO TABS: 25.0000 mg | ORAL_TABLET | Freq: Four times a day (QID) | ORAL | 2 refills | Status: DC | PRN

## 2020-02-02 NOTE — Progress Notes (Signed)
I connected with Tina [name]  on 02/02/20 at  by telephone and verified that I am speaking with the correct person using two identifiers.   I discussed the limitations, risks, security and privacy concerns of performing an evaluation and management service by telephone and the availability of in person appointments. I also discussed with the patient that there may be a patient responsible charge related to this service. The patient expressed understanding and agreed to proceed.  The patient was at home I spoke with the patient from my workstation phone The names of people involved in this encounter were: Tina [name] , and Vena Austria   Gynecology Office Visit  Chief Complaint: No chief complaint on file.   History of Present Illness: Patientis a 19 y.o. G0P0000 female, who presents for the evaluation of the desire to lose weight. She has lost 11 pounds 1 months. The patient states the following symptoms since starting her weight loss therapy: appetite suppression, energy, and weight loss.  The patient also reports no other ill effects. The patient specifically denies heart palpitations, anxiety, and insomnia.    Review of Systems: 10 point review of systems negative unless otherwise noted in HPI  Past Medical History:  Past Medical History:  Diagnosis Date  . Anemia     Past Surgical History:  No past surgical history on file.  Gynecologic History: No LMP recorded. (Menstrual status: Oral contraceptives).  Obstetric History: G0P0000  Family History:  Family History  Problem Relation Age of Onset  . Lung cancer Maternal Grandfather   . Breast cancer Paternal Grandmother 23       Malignant neoplasms, Estrogen Receptor positive    Social History:  Social History   Socioeconomic History  . Marital status: Single    Spouse name: Not on file  . Number of children: Not on file  . Years of education: Not on file  . Highest education level: Not on file    Occupational History  . Not on file  Tobacco Use  . Smoking status: Never Smoker  . Smokeless tobacco: Never Used  Vaping Use  . Vaping Use: Never used  Substance and Sexual Activity  . Alcohol use: No  . Drug use: No  . Sexual activity: Never    Birth control/protection: None  Other Topics Concern  . Not on file  Social History Narrative  . Not on file   Social Determinants of Health   Financial Resource Strain:   . Difficulty of Paying Living Expenses: Not on file  Food Insecurity:   . Worried About Programme researcher, broadcasting/film/video in the Last Year: Not on file  . Ran Out of Food in the Last Year: Not on file  Transportation Needs:   . Lack of Transportation (Medical): Not on file  . Lack of Transportation (Non-Medical): Not on file  Physical Activity:   . Days of Exercise per Week: Not on file  . Minutes of Exercise per Session: Not on file  Stress:   . Feeling of Stress : Not on file  Social Connections:   . Frequency of Communication with Friends and Family: Not on file  . Frequency of Social Gatherings with Friends and Family: Not on file  . Attends Religious Services: Not on file  . Active Member of Clubs or Organizations: Not on file  . Attends Banker Meetings: Not on file  . Marital Status: Not on file  Intimate Partner Violence:   . Fear of  Current or Ex-Partner: Not on file  . Emotionally Abused: Not on file  . Physically Abused: Not on file  . Sexually Abused: Not on file    Allergies:  No Known Allergies  Medications: Prior to Admission medications   Medication Sig Start Date End Date Taking? Authorizing Provider  escitalopram (LEXAPRO) 20 MG tablet Take 1 tablet (20 mg total) by mouth daily. 02/02/20   Vena Austria, MD  hydrOXYzine (ATARAX/VISTARIL) 25 MG tablet Take 1 tablet (25 mg total) by mouth every 6 (six) hours as needed for anxiety. 02/02/20   Vena Austria, MD  Levonorgest-Eth Est & Eth Est Nilda Calamity) 42-21-21-7 DAYS TABS Take 1  tablet by mouth daily. 02/02/20 05/03/20  Vena Austria, MD  phentermine (ADIPEX-P) 37.5 MG tablet Take 1 tablet (37.5 mg total) by mouth daily before breakfast. 02/02/20   Vena Austria, MD    Physical Exam There were no vitals taken for this visit. Wt Readings from Last 3 Encounters:  12/15/19 248 lb (112.5 kg)  09/13/19 240 lb (108.9 kg) (>99 %, Z= 2.42)*  08/16/19 230 lb (104.3 kg) (99 %, Z= 2.32)*   * Growth percentiles are based on CDC (Girls, 2-20 Years) data.   Weight down to 237lbs  No physical exam as this was a remote telephone visit to promote social distancing during the current COVID-19 Pandemic   Assessment: 19 y.o. G0P0000 No problem-specific Assessment & Plan notes found for this encounter.   Plan: Problem List Items Addressed This Visit    None      1) 1500 Calorie ADA Diet  2) Patient education given regarding appropriate lifestyle changes for weight loss including: regular physical activity, healthy coping strategies, caloric restriction and healthy eating patterns.  3) Patient will be started on weight loss medication. The risks and benefits and side effects of medication, such as Adipex (Phenteramine) ,  Tenuate (Diethylproprion), Belviq (lorcarsin), Contrave (buproprion/naltrexone), Qsymia (phentermine/topiramate), and Saxenda (liraglutide) is discussed. The pros and cons of suppressing appetite and boosting metabolism is discussed. Risks of tolerence and addiction is discussed for selected agents discussed. Use of medicine will ne short term, such as 3-4 months at a time followed by a period of time off of the medicine to avoid these risks and side effects for Adipex, Qsymia, and Tenuate discussed. Pt to call with any negative side effects and agrees to keep follow up appts.  4) Patient to take medication, with the benefits of appetite suppression and metabolism boost d/w pt, along with the side effects and risk factors of long term use that will be  avoided with our use of short bursts of therapy. Rx provided.    5) Telephone time 5 min  6)  Follow up 4 weeks   Vena Austria, MD, [facility], [facility] 02/02/2020, 5:03 PM

## 2020-02-04 ENCOUNTER — Other Ambulatory Visit: Payer: Self-pay

## 2020-02-10 ENCOUNTER — Ambulatory Visit: Payer: 59 | Admitting: Obstetrics and Gynecology

## 2020-07-13 ENCOUNTER — Ambulatory Visit (INDEPENDENT_AMBULATORY_CARE_PROVIDER_SITE_OTHER): Payer: 59 | Admitting: Obstetrics and Gynecology

## 2020-07-13 ENCOUNTER — Encounter: Payer: Self-pay | Admitting: Obstetrics and Gynecology

## 2020-07-13 VITALS — Ht 61.0 in | Wt 237.0 lb

## 2020-07-13 DIAGNOSIS — F321 Major depressive disorder, single episode, moderate: Secondary | ICD-10-CM

## 2020-07-13 DIAGNOSIS — L650 Telogen effluvium: Secondary | ICD-10-CM

## 2020-07-13 DIAGNOSIS — Z3041 Encounter for surveillance of contraceptive pills: Secondary | ICD-10-CM | POA: Diagnosis not present

## 2020-07-13 MED ORDER — LEVONORGEST-ETH EST & ETH EST 42-21-21-7 DAYS PO TABS: 1.0000 | ORAL_TABLET | Freq: Every day | ORAL | 3 refills | Status: DC

## 2020-07-13 NOTE — Progress Notes (Signed)
I connected with Tina [name] on 07/13/20 at 11:30 AM EST by telephone and verified that I am speaking with the correct person using two identifiers.   I discussed the limitations, risks, security and privacy concerns of performing an evaluation and management service by telephone and the availability of in person appointments. I also discussed with the patient that there may be a patient responsible charge related to this service. The patient expressed understanding and agreed to proceed.  The patient was at home I spoke with the patient from my workstation phone The names of people involved in this encounter were: Gerlean A [name] , and [facility]   Obstetrics & Gynecology Office Visit   Chief Complaint:  Chief Complaint  Patient presents with  . Follow-up    Phone visit -F/U medication, hair is thinning    History of Present Illness: The patient is a 20 y.o. female presenting follow up for symptoms of anxiety and depression.  The patient is currently taking Lexparo 20mg  for the management of her symptoms.  She has not had any recent situational stressors.  She reports symptoms of anhedonia, day time somnolence, irritability, decreased appetite, feelings of guilt and feelings of worthlessness.  She denies risk taking behavior, increased appetite, social anxiety, agorophobia, suicidal ideation, homicidal ideation, auditory hallucinations and visual hallucinations. Symptoms have remained unchanged since last visit.     The patient does have a pre-existing history of depression and anxiety.  She  does not a prior history of suicide attempts.    Doing well on quartette as far as cycle control.  Review of Systems: Review of Systems  Constitutional: Negative.   Gastrointestinal: Negative for nausea.  Neurological: Negative for headaches.  Psychiatric/Behavioral: Positive for depression. Negative for hallucinations, memory loss, substance abuse and suicidal ideas. The patient is  nervous/anxious and has insomnia.      Past Medical History:  Past Medical History:  Diagnosis Date  . Anemia     Past Surgical History:  History reviewed. No pertinent surgical history.  Gynecologic History: No LMP recorded. (Menstrual status: Oral contraceptives).  Obstetric History: G0P0000  Family History:  Family History  Problem Relation Age of Onset  . Lung cancer Maternal Grandfather   . Breast cancer Paternal Grandmother 64       Malignant neoplasms, Estrogen Receptor positive    Social History:  Social History   Socioeconomic History  . Marital status: Single    Spouse name: Not on file  . Number of children: Not on file  . Years of education: Not on file  . Highest education level: Not on file  Occupational History  . Not on file  Tobacco Use  . Smoking status: Never Smoker  . Smokeless tobacco: Never Used  Vaping Use  . Vaping Use: Never used  Substance and Sexual Activity  . Alcohol use: No  . Drug use: No  . Sexual activity: Never    Birth control/protection: None  Other Topics Concern  . Not on file  Social History Narrative  . Not on file   Social Determinants of Health   Financial Resource Strain: Not on file  Food Insecurity: Not on file  Transportation Needs: Not on file  Physical Activity: Not on file  Stress: Not on file  Social Connections: Not on file  Intimate Partner Violence: Not on file    Allergies:  No Known Allergies  Medications: Prior to Admission medications   Medication Sig Start Date End Date Taking? Authorizing Provider  escitalopram (LEXAPRO) 20 MG tablet Take 1 tablet (20 mg total) by mouth daily. 02/02/20  Yes Vena Austria, MD  hydrOXYzine (ATARAX/VISTARIL) 25 MG tablet Take 1 tablet (25 mg total) by mouth every 6 (six) hours as needed for anxiety. 02/02/20  Yes Vena Austria, MD  Levonorgest-Eth Est & Eth Est Nilda Calamity) 42-21-21-7 DAYS TABS Take 1 tablet by mouth daily. 02/02/20 05/03/20  Vena Austria, MD  phentermine (ADIPEX-P) 37.5 MG tablet Take 1 tablet (37.5 mg total) by mouth daily before breakfast. 02/02/20   Vena Austria, MD    Physical Exam Vitals: There were no vitals filed for this visit. No LMP recorded. (Menstrual status: Oral contraceptives).  No physical exam as this was a remote telephone visit to promote social distancing during the current COVID-19 Pandemic  GAD 7 : Generalized Anxiety Score 07/13/2020 08/16/2019 04/20/2019 01/25/2019  Nervous, Anxious, on Edge 2 1 1 2   Control/stop worrying 2 0 0 1  Worry too much - different things 2 2 1 1   Trouble relaxing 1 1 0 0  Restless 1 0 0 0  Easily annoyed or irritable 2 1 2 2   Afraid - awful might happen 0 1 1 1   Total GAD 7 Score 10 6 5 7   Anxiety Difficulty Somewhat difficult Not difficult at all Somewhat difficult Somewhat difficult    Depression screen [facility] 2/9 07/13/2020 08/16/2019 04/20/2019  Decreased Interest 2 1 1   Down, Depressed, Hopeless 2 1 1   PHQ - 2 Score 4 2 2   Altered sleeping 3 1 0  Tired, decreased energy 3 3 3   Change in appetite 3 3 1   Feeling bad or failure about yourself  2 1 1   Trouble concentrating 0 0 0  Moving slowly or fidgety/restless 0 0 0  Suicidal thoughts 0 0 0  PHQ-9 Score 15 10 7   Difficult doing work/chores Somewhat difficult Not difficult at all Somewhat difficult    Depression screen [facility] 2/9 07/13/2020 08/16/2019 04/20/2019 01/25/2019 01/08/2019  Decreased Interest 2 1 1 2 3   Down, Depressed, Hopeless 2 1 1 1 2   PHQ - 2 Score 4 2 2 3 5   Altered sleeping 3 1 0 1 2  Tired, decreased energy 3 3 3 1 3   Change in appetite 3 3 1 1 3   Feeling bad or failure about yourself  2 1 1 1 2   Trouble concentrating 0 0 0 1 2  Moving slowly or fidgety/restless 0 0 0 0 1  Suicidal thoughts 0 0 0 0 0  PHQ-9 Score 15 10 7 8 18   Difficult doing work/chores Somewhat difficult Not difficult at all Somewhat difficult Somewhat difficult Somewhat difficult   Assessment: 20 y.o. G0P0000  follow up anxiety and depression  Plan: Problem List Items Addressed This Visit   None   Visit Diagnoses    Current moderate episode of major depressive disorder without prior episode (HCC)    -  Primary   Relevant Orders   TSH   Telogen effluvium       Relevant Orders   TSH   Encounter for surveillance of contraceptive pills          1) Anxiety depression  2) Thyroid and B12 screen has been obtained previously - given hair loss noted will check TSH again last checked 04/2019  3) AUB - continue quartette OCP  4) Telephone time 15:20min   , MD, OB/GYN, [facility] 07/13/2020, 12:31 PM

## 2020-07-14 ENCOUNTER — Other Ambulatory Visit: Payer: 59

## 2020-07-14 ENCOUNTER — Other Ambulatory Visit: Payer: Self-pay

## 2020-07-14 DIAGNOSIS — F321 Major depressive disorder, single episode, moderate: Secondary | ICD-10-CM

## 2020-07-14 DIAGNOSIS — L650 Telogen effluvium: Secondary | ICD-10-CM

## 2020-07-15 LAB — TSH: TSH: 5.41 u[IU]/mL — ABNORMAL HIGH (ref 0.450–4.500)

## 2020-07-17 ENCOUNTER — Other Ambulatory Visit: Payer: Self-pay | Admitting: Obstetrics and Gynecology

## 2020-07-17 DIAGNOSIS — R7989 Other specified abnormal findings of blood chemistry: Secondary | ICD-10-CM

## 2020-07-17 DIAGNOSIS — L650 Telogen effluvium: Secondary | ICD-10-CM

## 2020-07-17 NOTE — Progress Notes (Signed)
elevate

## 2020-07-19 LAB — SPECIMEN STATUS REPORT

## 2020-07-19 LAB — T4, FREE: Free T4: 1.27 ng/dL (ref 0.82–1.77)

## 2020-07-24 ENCOUNTER — Encounter: Payer: Self-pay | Admitting: Obstetrics and Gynecology

## 2020-07-24 ENCOUNTER — Other Ambulatory Visit: Payer: Self-pay | Admitting: Obstetrics and Gynecology

## 2020-07-24 MED ORDER — BUSPIRONE HCL 7.5 MG PO TABS: 15.0000 mg | ORAL_TABLET | Freq: Two times a day (BID) | ORAL | 2 refills | Status: DC

## 2020-07-24 MED ORDER — HYDROXYZINE HCL 25 MG PO TABS: 25.0000 mg | ORAL_TABLET | Freq: Four times a day (QID) | ORAL | 2 refills | Status: DC | PRN

## 2020-07-24 NOTE — Progress Notes (Signed)
Thyroid panel results reviewed with patient.  Had previously discussed adding buspar to lexapro.  Patient amenable will start on 7.5mg  po daily and follow up to assess response in 6 weeks

## 2020-08-02 ENCOUNTER — Other Ambulatory Visit: Payer: Self-pay | Admitting: Obstetrics and Gynecology

## 2020-08-31 ENCOUNTER — Ambulatory Visit: Payer: 59 | Admitting: General Surgery

## 2020-09-05 ENCOUNTER — Other Ambulatory Visit: Payer: Self-pay

## 2020-09-05 ENCOUNTER — Ambulatory Visit (INDEPENDENT_AMBULATORY_CARE_PROVIDER_SITE_OTHER): Payer: 59 | Admitting: Obstetrics and Gynecology

## 2020-09-05 DIAGNOSIS — F32A Depression, unspecified: Secondary | ICD-10-CM

## 2020-09-05 DIAGNOSIS — F419 Anxiety disorder, unspecified: Secondary | ICD-10-CM

## 2020-09-05 MED ORDER — BUPROPION HCL ER (XL) 150 MG PO TB24: 150.0000 mg | ORAL_TABLET | Freq: Every day | ORAL | 2 refills | Status: DC

## 2020-09-05 NOTE — Progress Notes (Signed)
I connected with Tina [name] on 09/05/20 at  4:10 PM EDT by telephone and verified that I am speaking with the correct person using two identifiers.   I discussed the limitations, risks, security and privacy concerns of performing an evaluation and management service by telephone and the availability of in person appointments. I also discussed with the patient that there may be a patient responsible charge related to this service. The patient expressed understanding and agreed to proceed.  The patient was at home I spoke with the patient from my workstation phone The names of people involved in this encounter were: Tina [name] , and [facility]   Obstetrics & Gynecology Office Visit   Chief Complaint: No chief complaint on file.   History of Present Illness: The patient is a 20 y.o. female presenting follow up for symptoms of anxiety and depression.  The patient is currently taking Lexapro 20mg  and buspar 15mg  po bid for the management of her symptoms.  She has not had any recent situational stressors.  She reports symptoms of anhedonia, day time somnolence, irritability, social anxiety, feelings of guilt and feelings of worthlessness.  She denies insomnia, agorophobia, suicidal ideation, homicidal ideation, auditory hallucinations and visual hallucinations. Symptoms have remained unchanged since last visit.     The patient does have a pre-existing history of depression and anxiety.  She  does not a prior history of suicide attempts.    Review of Systems: Review of Systems  Constitutional: Positive for malaise/fatigue. Negative for chills, diaphoresis, fever and weight loss.  Gastrointestinal: Negative for nausea.  Neurological: Negative for headaches.  Psychiatric/Behavioral: Positive for depression. Negative for hallucinations, memory loss, substance abuse and suicidal ideas. The patient is nervous/anxious. The patient does not have insomnia.      Past Medical History:   Past Medical History:  Diagnosis Date  . Anemia     Past Surgical History:  No past surgical history on file.  Gynecologic History: No LMP recorded. (Menstrual status: Oral contraceptives).  Obstetric History: G0P0000  Family History:  Family History  Problem Relation Age of Onset  . Lung cancer Maternal Grandfather   . Breast cancer Paternal Grandmother 36       Malignant neoplasms, Estrogen Receptor positive    Social History:  Social History   Socioeconomic History  . Marital status: Single    Spouse name: Not on file  . Number of children: Not on file  . Years of education: Not on file  . Highest education level: Not on file  Occupational History  . Not on file  Tobacco Use  . Smoking status: Never Smoker  . Smokeless tobacco: Never Used  Vaping Use  . Vaping Use: Never used  Substance and Sexual Activity  . Alcohol use: No  . Drug use: No  . Sexual activity: Never    Birth control/protection: None  Other Topics Concern  . Not on file  Social History Narrative  . Not on file   Social Determinants of Health   Financial Resource Strain: Not on file  Food Insecurity: Not on file  Transportation Needs: Not on file  Physical Activity: Not on file  Stress: Not on file  Social Connections: Not on file  Intimate Partner Violence: Not on file    Allergies:  No Known Allergies  Medications: Prior to Admission medications   Medication Sig Start Date End Date Taking? Authorizing Provider  busPIRone (BUSPAR) 7.5 MG tablet TAKE 2 TABLETS (15 MG TOTAL) BY MOUTH  2 (TWO) TIMES DAILY. 08/02/20   Vena Austria, MD  escitalopram (LEXAPRO) 20 MG tablet Take 1 tablet (20 mg total) by mouth daily. 02/02/20   Vena Austria, MD  hydrOXYzine (ATARAX/VISTARIL) 25 MG tablet Take 1 tablet (25 mg total) by mouth every 6 (six) hours as needed for anxiety. 07/24/20   Vena Austria, MD  Levonorgest-Eth Est & Eth Est Nilda Calamity) 42-21-21-7 DAYS TABS Take 1 tablet by  mouth daily. 07/13/20 10/12/20  Vena Austria, MD    Physical Exam Vitals: There were no vitals filed for this visit. No LMP recorded. (Menstrual status: Oral contraceptives).  No physical exam as this was a remote telephone visit to promote social distancing during the current COVID-19 Pandemic   GAD 7 : Generalized Anxiety Score 07/13/2020 08/16/2019 04/20/2019 01/25/2019  Nervous, Anxious, on Edge 2 1 1 2   Control/stop worrying 2 0 0 1  Worry too much - different things 2 2 1 1   Trouble relaxing 1 1 0 0  Restless 1 0 0 0  Easily annoyed or irritable 2 1 2 2   Afraid - awful might happen 0 1 1 1   Total GAD 7 Score 10 6 5 7   Anxiety Difficulty Somewhat difficult Not difficult at all Somewhat difficult Somewhat difficult    Depression screen [facility] 2/9 07/13/2020 08/16/2019 04/20/2019  Decreased Interest 2 1 1   Down, Depressed, Hopeless 2 1 1   PHQ - 2 Score 4 2 2   Altered sleeping 3 1 0  Tired, decreased energy 3 3 3   Change in appetite 3 3 1   Feeling bad or failure about yourself  2 1 1   Trouble concentrating 0 0 0  Moving slowly or fidgety/restless 0 0 0  Suicidal thoughts 0 0 0  PHQ-9 Score 15 10 7   Difficult doing work/chores Somewhat difficult Not difficult at all Somewhat difficult    Depression screen [facility] 2/9 07/13/2020 08/16/2019 04/20/2019 01/25/2019 01/08/2019  Decreased Interest 2 1 1 2 3   Down, Depressed, Hopeless 2 1 1 1 2   PHQ - 2 Score 4 2 2 3 5   Altered sleeping 3 1 0 1 2  Tired, decreased energy 3 3 3 1 3   Change in appetite 3 3 1 1 3   Feeling bad or failure about yourself  2 1 1 1 2   Trouble concentrating 0 0 0 1 2  Moving slowly or fidgety/restless 0 0 0 0 1  Suicidal thoughts 0 0 0 0 0  PHQ-9 Score 15 10 7 8 18   Difficult doing work/chores Somewhat difficult Not difficult at all Somewhat difficult Somewhat difficult Somewhat difficult     Assessment: 20 y.o. G0P0000 medication follow up anxiety and depression  Plan: Problem List Items Addressed This  Visit   None   Visit Diagnoses    Anxiety and depression    -  Primary   Relevant Medications   buPROPion (WELLBUTRIN XL) 150 MG 24 hr tablet      1) Anxiety depression - no improvement in symptoms with addition of Buspar - Discontinue Lexapro and Buspar - Switch to Wellbutrin XL 150mg , most pronounced symptom is fatigue  2) Thyroid and B12 screen has been obtained previously  3) Telephone time 12:23min  4) Return in about 2 weeks (around 09/19/2020) for medication follow up phone.   , MD, [facility], [facility] 09/05/2020, 4:59 PM

## 2020-09-12 ENCOUNTER — Other Ambulatory Visit: Payer: Self-pay

## 2020-09-12 ENCOUNTER — Ambulatory Visit: Payer: 59 | Admitting: General Surgery

## 2020-09-12 ENCOUNTER — Encounter: Payer: Self-pay | Admitting: General Surgery

## 2020-09-12 VITALS — BP 128/68 | HR 102 | Temp 98.8°F | Ht 61.0 in | Wt 236.0 lb

## 2020-09-12 DIAGNOSIS — E039 Hypothyroidism, unspecified: Secondary | ICD-10-CM | POA: Diagnosis not present

## 2020-09-12 DIAGNOSIS — K8020 Calculus of gallbladder without cholecystitis without obstruction: Secondary | ICD-10-CM | POA: Insufficient documentation

## 2020-09-12 MED ORDER — LEVOTHYROXINE SODIUM 25 MCG PO TABS: 25.0000 ug | ORAL_TABLET | Freq: Every day | ORAL | 3 refills | Status: DC

## 2020-09-12 NOTE — Progress Notes (Signed)
Patient ID: Tina [name], female   DOB: 11-Feb-2001, 19 y.o.   MRN: [id number]  Chief Complaint  Patient presents with  . New Patient (Initial Visit)    Gallbladder     HPI Tina [name] is a 20 y.o. female.   She has been referred by her primary care provider for surgical evaluation of symptomatic cholelithiasis.  Tina [name] states that when she was on spring break in March, she had severe abdominal pain, localized to the right upper quadrant that occurred on 2 consecutive nights, both after eating fatty foods (pizza, [place] food).  She said it was so severe, that she was doubled up in pain.  It was localized to the right upper quadrant and did not radiate.  The only thing that made it better was time.  She had nausea and vomiting with these episodes, as well as subjective fevers and chills.  She did not have any diarrhea.  She has altered her diet since that time, and has not had further episodes.  She does endorse significant heartburn/acid reflux for which she takes omeprazole on an as-needed basis.  She has never had jaundice or pancreatitis.  No acholic stools, or dark, Coca-Cola-colored urine.  2 weeks ago, her primary care provider ordered an ultrasound which demonstrated cholelithiasis without concern for cholecystitis.  She is here today to discuss the possibility of cholecystectomy.  She says that multiple family members have required the operation.   Past Medical History:  Diagnosis Date  . Anemia   . Anxiety   . Depression   . GERD (gastroesophageal reflux disease)   . Thyroid disease     History reviewed. No pertinent surgical history.  Family History  Problem Relation Age of Onset  . Lung cancer Maternal Grandfather   . Breast cancer Paternal Grandmother 7       Malignant neoplasms, Estrogen Receptor positive    Social History Social History   Tobacco Use  . Smoking status: Never Smoker  . Smokeless tobacco: Never Used  Vaping Use  . Vaping Use: Never used   Substance Use Topics  . Alcohol use: No  . Drug use: No    No Known Allergies  Current Outpatient Medications  Medication Sig Dispense Refill  . buPROPion (WELLBUTRIN XL) 150 MG 24 hr tablet Take 1 tablet (150 mg total) by mouth daily. 30 tablet 2  . Levonorgest-Eth Est & Eth Est (QUARTETTE) 42-21-21-7 DAYS TABS Take 1 tablet by mouth daily. 91 tablet 3  . levothyroxine (SYNTHROID) 25 MCG tablet Take 1 tablet (25 mcg total) by mouth daily before breakfast. 30 tablet 3   No current facility-administered medications for this visit.    Review of Systems Review of Systems  All other systems reviewed and are negative. Or as discussed in history of present illness.  Blood pressure 128/68, pulse (!) 102, temperature 98.8 F (37.1 C), temperature source Oral, height 5\' 1"  (1.549 m), weight 236 lb (107 kg), SpO2 99 %.  Physical Exam Physical Exam Constitutional:      General: She is not in acute distress.    Appearance: She is obese.  HENT:     Head: Normocephalic and atraumatic.     Nose:     Comments: Covered with a mask    Mouth/Throat:     Comments: Covered with a mask Eyes:     General: No scleral icterus.       Right eye: No discharge.        Left eye:  No discharge.  Neck:     Comments: No palpable cervical or supraclavicular lymphadenopathy.  The trachea is midline.  No dominant thyroid masses or thyromegaly appreciated.  The gland moves freely with deglutition. Cardiovascular:     Rate and Rhythm: Normal rate and regular rhythm.     Pulses: Normal pulses.  Pulmonary:     Effort: Pulmonary effort is normal.     Breath sounds: Normal breath sounds.  Abdominal:     General: There is no distension.     Palpations: Abdomen is soft.     Tenderness: There is no abdominal tenderness. There is no guarding or rebound.     Comments: Protuberant, consistent with her level of obesity.  Murphy sign is negative.  Genitourinary:    Comments: Deferred Musculoskeletal:         General: No swelling or tenderness.  Skin:    General: Skin is warm and dry.  Neurological:     General: No focal deficit present.     Mental Status: She is alert and oriented to person, place, and time.  Psychiatric:        Mood and Affect: Mood normal.        Behavior: Behavior normal.     Data Reviewed I reviewed a number of faxed clinic notes from [facility] practices, located in [place].  The initial visit on August 24, 2020 was a visit to establish care and to follow-up from the emergency department from her abdominal pain.  Pertinent components of this visit include the report of cholelithiasis without cholecystitis seen on abdominal ultrasound and physical exam findings that included a negative Murphy sign.  I also reviewed Dr. Ramiro Harvest note from Sep 05, 2020.  This was a telephone visit for depression and anxiety.  Based upon her lack of improvement with BuSpar and Lexapro, she was switched to Wellbutrin XL.  Results for [name], Tina A (MRN [id number]) as of 09/12/2020 12:29  Ref. Range 12/11/2016 11:22 04/20/2019 14:08 07/14/2020 11:51  TSH Latest Ref Range: 0.450 - 4.500 uIU/mL 3.900 4.050 5.410 (H)  T4,Free(Direct) Latest Ref Range: 0.82 - 1.77 ng/dL   7.65  These labs show a progressive increase in her TSH, suggesting impending clinical hypothyroidism, but her current free T4 is still within normal range.   Assessment This is a 20 year old woman who has had 2 attacks of symptomatic cholelithiasis.  Both occurred in relationship to fatty food consumption.  I discussed with her that via dietary modification, it is possible that she might never have another incident, due to her family history, however, she is interested in having her gallbladder removed.  She is also mildly hypothyroid, currently compensated.  Plan I have offered her a robot-assisted laparoscopic cholecystectomy.  I discussed the procedure in detail.  We discussed the risks and  benefits of a laparoscopic cholecystectomy and possible cholangiogram including, but not limited to: bleeding, infection, injury to surrounding structures such as the intestine or liver, bile leak, retained gallstones, need to convert to an open procedure, prolonged diarrhea, blood clots such as DVT, common bile duct injury, anesthesia risks, and possible need for additional procedures. The patient had the opportunity to ask any questions and these were answered to her satisfaction.  Additionally, I have prescribed levothyroxine 25 mcg daily to address her subclinical hypothyroidism, which may actually be mildly clinical, manifesting primarily as fatigue  We will work on getting her scheduled for surgery.  Duanne Guess 09/12/2020, 12:23 PM

## 2020-09-12 NOTE — Patient Instructions (Addendum)
Our surgery scheduler will call you within 24-48 hours to schedule your surgery. Please have the blue surgery sheet available when speaking with her.  Please pick up your medication at the pharmacy and begin taking it tomorrow. Wait 1 hour before taking the Omeprazole.    Cholelithiasis  Cholelithiasis happens when gallstones form in the gallbladder. The gallbladder stores bile. Bile is a fluid that helps digest fats. Bile can harden and form into gallstones. If they cause a blockage, they can cause pain (gallbladder attack). What are the causes? This condition may be caused by:  Some blood diseases, such as sickle cell anemia.  Too much of a fat-like substance (cholesterol) in your bile.  Not enough bile salts in your bile. These salts help the body absorb and digest fats.  The gallbladder not emptying fully or often enough. This is common in pregnant women. What increases the risk? The following factors may make you more likely to develop this condition:  Being female.  Being pregnant many times.  Eating a lot of fried foods, fat, and refined carbs (refined carbohydrates).  Being very overweight (obese).  Being older than age 21.  Using medicines with female hormones in them for a long time.  Losing weight fast.  Having gallstones in your family.  Having some health problems, such as diabetes, Crohn's disease, or liver disease. What are the signs or symptoms? Often, there may be gallstones but no symptoms. These gallstones are called silent gallstones. If a gallstone causes a blockage, you may get sudden pain. The pain:  Can be in the upper right part of your belly (abdomen).  Normally comes at night or after you eat.  Can last an hour or more.  Can spread to your right shoulder, back, or chest.  Can feel like discomfort, burning, or fullness in the upper part of your belly (indigestion). If the blockage lasts more than a few hours, you can get an infection or  swelling. You may:  Feel like you may vomit.  Vomit.  Feel bloated.  Have belly pain for 5 hours or more.  Feel tender in your belly, often in the upper right part and under your ribs.  Have fever or chills.  Have skin or the white parts of your eyes turn yellow (jaundice).  Have dark pee (urine) or pale poop (stool). How is this treated? Treatment for this condition depends on how bad you feel. If you have symptoms, you may need:  Home care, if symptoms are not very bad. ? Do not eat for 12-24 hours. Drink only water and clear liquids. ? Start to eat simple or clear foods after 1 or 2 days. Try broths and crackers. ? You may need medicines for pain or stomach upset or both. ? If you have an infection, you will need antibiotics.  A hospital stay, if you have very bad pain or a very bad infection.  Surgery to remove your gallbladder. You may need this if: ? Gallstones keep coming back. ? You have very bad symptoms.  Medicines to break up gallstones. Medicines: ? Are best for small gallstones. ? May be used for up to 6-12 months.  A procedure to find and take out gallstones or to break up gallstones. Follow these instructions at home: Medicines  Take over-the-counter and prescription medicines only as told by your doctor.  If you were prescribed an antibiotic medicine, take it as told by your doctor. Do not stop taking the antibiotic even if you start  to feel better.  Ask your doctor if the medicine prescribed to you requires you to avoid driving or using machinery. Eating and drinking  Drink enough fluid to keep your urine pale yellow. Drink water or clear fluids. This is important when you have pain.  Eat healthy foods. Choose: ? Fewer fatty foods, such as fried foods. ? Fewer refined carbs. Avoid breads and grains that are highly processed, such as white bread and white rice. Choose whole grains, such as whole-wheat bread and brown rice. ? More fiber. Almonds,  fresh fruit, and beans are healthy sources. General instructions  Keep a healthy weight.  Keep all follow-up visits as told by your doctor. This is important. Where to find more information  [employer] of Diabetes and Digestive and Kidney Diseases: [employer] Contact a doctor if:  You have sudden pain in the upper right part of your belly. Pain might spread to your right shoulder, back, or chest.  You have been diagnosed with gallstones that have no symptoms and you get: ? Belly pain. ? Discomfort, burning, or fullness in the upper part of your abdomen.  You have dark urine or pale stools. Get help right away if:  You have sudden pain in the upper right part of your abdomen, and the pain lasts more than 2 hours.  You have pain in your abdomen, and: ? It lasts more than 5 hours. ? It keeps getting worse.  You have a fever or chills.  You keep feeling like you may vomit.  You keep vomiting.  Your skin or the white parts of your eyes turn yellow. Summary  Cholelithiasis happens when gallstones form in the gallbladder.  This condition may be caused by a blood disease, too much of a fat-like substance in the bile, or not enough bile salts in bile.  Treatment for this condition depends on how bad you feel.  If you have symptoms, do not eat or drink. You may need medicines. You may need a hospital stay for very bad pain or a very bad infection.  You may need surgery if gallstones keep coming back or if you have very bad symptoms. This information is not intended to replace advice given to you by your health care provider. Make sure you discuss any questions you have with your health care provider. Document Revised: 06/11/2019 Document Reviewed: 03/15/2019 Elsevier Patient Education  2021 Elsevier Inc. Please pick up your medication and begin taking it tomorrow.  Minimally Invasive Cholecystectomy, Care After This sheet gives you information about how to care  for yourself after your procedure. Your doctor may also give you more specific instructions. If you have problems or questions, contact your doctor. What can I expect after the procedure? After the procedure, it is common:  To have pain at the areas of surgery. You will be given medicines for pain.  To vomit or feel like you may vomit.  To feel fullness in the belly (bloating) or to have pain in the shoulder. This comes from the gas that was used during the surgery. Follow these instructions at home: Medicines  Take over-the-counter and prescription medicines only as told by your doctor.  If you were prescribed an antibiotic medicine, take it as told by your doctor. Do not stop using the antibiotic even if you start to feel better.  Ask your doctor if the medicine prescribed to you: ? Requires you to avoid driving or using machinery. ? Can cause trouble pooping (constipation). You may need to  take these actions to prevent or treat trouble pooping:  Drink enough fluid to keep your pee (urine) pale yellow.  Take over-the-counter or prescription medicines.  Eat foods that are high in fiber. These include beans, whole grains, and fresh fruits and vegetables.  Limit foods that are high in fat and sugar. These include fried or sweet foods. Incision care  Follow instructions from your doctor about how to take care of your cuts from surgery (incisions). Make sure you: ? Wash your hands with soap and water for at least 20 seconds before and after you change your bandage (dressing). If you cannot use soap and water, use hand sanitizer. ? Change your bandage as told by your doctor. ? Leave stitches (sutures), skin glue, or skin tape (adhesive) strips in place. They may need to stay in place for 2 weeks or longer. If tape strips get loose and curl up, you may trim the loose edges. Do not remove tape strips completely unless your doctor says it is okay.  Do not take baths, swim, or use a hot tub  until your doctor approves. Ask your doctor if you may take showers. You may only be allowed to take sponge baths.  Check your surgery area every day for signs of infection. Check for: ? More redness, swelling, or pain. ? Fluid or blood. ? Warmth. ? Pus or a bad smell.   Activity  Rest as told by your doctor.  Do not sit for a long time without moving. Get up to take short walks every 1-2 hours. This is important. Ask for help if you feel weak or unsteady.  Do not lift anything that is heavier than 10 lb (4.5 kg), or the limit that you are told, until your doctor says that it is safe.  Do not play contact sports until your doctor says it is okay.  Do not return to work or school until your doctor says it is okay.  Return to your normal activities as told by your doctor. Ask your doctor what activities are safe for you. General instructions  If you were given a medicine to help you relax (sedative) during your procedure, it can affect you for many hours. Do not drive or use machinery until your doctor says that it is safe.  Keep all follow-up visits as told by your doctor. This is important. Contact a doctor if:  You get a rash.  You have more redness, swelling, or pain around your cuts from surgery.  You have fluid or blood coming from your cuts from surgery.  Your cuts from surgery feel warm to the touch.  You have pus or a bad smell coming from your cuts from surgery.  You have a fever.  One or more of your cuts from surgery breaks open. Get help right away if:  You have trouble breathing.  You have chest pain.  You have pain that is getting worse in your shoulders.  You faint or feel dizzy when you stand.  You have very bad pain in your belly (abdomen).  You feel like you may vomit or you vomit, and this lasts for more than one day.  You have leg pain. Summary  After your surgery, it is common to have pain at the areas of surgery. You may also have vomiting  or fullness in the belly.  Follow your doctor's instructions about medicine, activity restrictions, and caring for your surgery areas. Do not do activities that require a lot of effort.  Contact a doctor if you have a fever or other signs of infection, such as more redness, swelling, or pain around the cuts from surgery.  Get help right away if you have chest pain, increasing pain in the shoulders, or trouble breathing. This information is not intended to replace advice given to you by your health care provider. Make sure you discuss any questions you have with your health care provider. Document Revised: 04/06/2019 Document Reviewed: 04/06/2019 Elsevier Patient Education  2021 [employer].

## 2020-09-15 ENCOUNTER — Telehealth: Payer: Self-pay | Admitting: General Surgery

## 2020-09-15 NOTE — Telephone Encounter (Signed)
Outbound call made to the pt to discuss surgery scheduling information.  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  Kirstie is aware she will be contacted sometime next week w/updated information.

## 2020-09-18 NOTE — Telephone Encounter (Signed)
Outgoing call is made regarding rescheduled surgery at patient's request.   Patient has been advised of Pre-Admission date/time, COVID Testing date and Surgery date.  Surgery Date: 11/24/20 Preadmission Testing Date: 11/14/20 (phone 1p-5p) Covid Testing Date: Not needed.    Patient has been made aware to call [phone], between 1-3:00pm the day before surgery, to find out what time to arrive for surgery.

## 2020-09-20 ENCOUNTER — Ambulatory Visit (INDEPENDENT_AMBULATORY_CARE_PROVIDER_SITE_OTHER): Payer: 59 | Admitting: Obstetrics and Gynecology

## 2020-09-20 ENCOUNTER — Other Ambulatory Visit: Payer: Self-pay

## 2020-09-20 DIAGNOSIS — E038 Other specified hypothyroidism: Secondary | ICD-10-CM | POA: Diagnosis not present

## 2020-09-20 DIAGNOSIS — Z111 Encounter for screening for respiratory tuberculosis: Secondary | ICD-10-CM | POA: Diagnosis not present

## 2020-09-20 DIAGNOSIS — N912 Amenorrhea, unspecified: Secondary | ICD-10-CM | POA: Diagnosis not present

## 2020-09-20 NOTE — Progress Notes (Signed)
I connected with Sudiksha A [place] on 09/20/20 at 11:10 AM EDT by telephone and verified that I am speaking with the correct person using two identifiers.   I discussed the limitations, risks, security and privacy concerns of performing an evaluation and management service by telephone and the availability of in person appointments. I also discussed with the patient that there may be a patient responsible charge related to this service. The patient expressed understanding and agreed to proceed.  The patient was at home I spoke with the patient from my workstation phone The names of people involved in this encounter were: Rewa A [place] , and [facility]   Obstetrics & Gynecology Office Visit   Chief Complaint:  Chief Complaint  Patient presents with  . Follow-up    Phone visit - discuss taking thyroid medication per her GI MD.     History of Present Illness: 20 y.o. G0P0000 who is scheduled to undergo cholecystectomy for symptomatic cholelithiasis. She was stated on Levothyroxine but has not started yet and wanted to discuss the pros and cons of replacement.  She has altered her eating habits and has not had any acute exacerbations of her cholelithiasis since.   Review of Systems: review of systems negative unless noted in HPI  Past Medical History:  Past Medical History:  Diagnosis Date  . Anemia   . Anxiety   . Depression   . GERD (gastroesophageal reflux disease)   . Thyroid disease     Past Surgical History:  No past surgical history on file.  Gynecologic History: No LMP recorded. (Menstrual status: Oral contraceptives).  Obstetric History: G0P0000  Family History:  Family History  Problem Relation Age of Onset  . Lung cancer Maternal Grandfather   . Breast cancer Paternal Grandmother 99       Malignant neoplasms, Estrogen Receptor positive    Social History:  Social History   Socioeconomic History  . Marital status: Single    Spouse name: Not  on file  . Number of children: Not on file  . Years of education: Not on file  . Highest education level: Not on file  Occupational History  . Not on file  Tobacco Use  . Smoking status: Never Smoker  . Smokeless tobacco: Never Used  Vaping Use  . Vaping Use: Never used  Substance and Sexual Activity  . Alcohol use: No  . Drug use: No  . Sexual activity: Never    Birth control/protection: None  Other Topics Concern  . Not on file  Social History Narrative  . Not on file   Social Determinants of Health   Financial Resource Strain: Not on file  Food Insecurity: Not on file  Transportation Needs: Not on file  Physical Activity: Not on file  Stress: Not on file  Social Connections: Not on file  Intimate Partner Violence: Not on file    Allergies:  Allergies  Allergen Reactions  . Latex Itching and Swelling    Medications: Prior to Admission medications   Medication Sig Start Date End Date Taking? Authorizing Provider  buPROPion (WELLBUTRIN XL) 150 MG 24 hr tablet Take 1 tablet (150 mg total) by mouth daily. 09/05/20  Yes Vena Austria, MD  Levonorgest-Eth Est & Eth Est Nilda Calamity) 42-21-21-7 DAYS TABS Take 1 tablet by mouth daily. 07/13/20 10/12/20 Yes Vena Austria, MD  levothyroxine (SYNTHROID) 25 MCG tablet Take 1 tablet (25 mcg total) by mouth daily before breakfast. 09/12/20   Duanne Guess, MD  Physical Exam Vitals: There were no vitals filed for this visit. No LMP recorded. (Menstrual status: Oral contraceptives).  No physical exam as this was a remote telephone visit to promote social distancing during the current COVID-19 Pandemic   Assessment: 20 y.o. G0P0000 presenting for discussion of recent recommendation by general surgery on starting levothyroxine   Plan: Problem List Items Addressed This Visit   None   Visit Diagnoses    Screening-pulmonary TB    -  Primary   Relevant Orders   QuantiFERON-TB Gold Plus   QuantiFERON-TB Gold Plus    Subclinical hypothyroidism       Relevant Orders   TSH   T4, free   Amenorrhea       Relevant Orders   Beta hCG quant (ref lab)     1) Subclinical hypothyroidism - discussed there is currently no consensus or evidence of clear benefit on repletion for patient with a TSH <10.0 and normal thyroid hormone levels.  We discussed that patient with subclinical hypothyroidism may have increased risk of progression to frank hypothyroidism particular if TPO antibodies are present.   - as no other symptoms expectant management.  She does report fatigue but this has been a symptom of her underlying depression for which she was switched to Wellbutrin XR 150mg .  Symptom also proceed diagnosis of subclinical hypothyroidism as it was present at the time of prior normal TSH evaluation 04/20/2019, and 12/21/2016. - can recheck thyroid panel if patient desires.   2) Telephone time 8 minutes 52 seconds  3) Return in about 4 weeks (around 10/18/2020) for medication follow up.   10/20/2020, MD, Vena Austria [facility], [facility] 09/20/2020, 11:47 AM

## 2020-09-21 ENCOUNTER — Other Ambulatory Visit: Admission: RE | Admit: 2020-09-21 | Payer: 59 | Source: Ambulatory Visit

## 2020-09-21 ENCOUNTER — Telehealth: Payer: Self-pay

## 2020-09-21 NOTE — Telephone Encounter (Signed)
Spoke w/patient. Assisted with my chart activation and navigating to immunizations. Advised we do not do TB Skin test, but can order TB Quantiferon Gold if this is sufficient for what she needs. She will check with her school and call back if she needs this ordered.

## 2020-09-21 NOTE — Telephone Encounter (Signed)
Patient inquiring about immunizations she has received from [place]. TB/Covid? [phone]

## 2020-09-22 ENCOUNTER — Other Ambulatory Visit: Payer: Self-pay | Admitting: Obstetrics and Gynecology

## 2020-09-22 ENCOUNTER — Other Ambulatory Visit: Payer: 59

## 2020-09-22 ENCOUNTER — Other Ambulatory Visit: Payer: Self-pay

## 2020-09-22 DIAGNOSIS — N912 Amenorrhea, unspecified: Secondary | ICD-10-CM

## 2020-09-22 DIAGNOSIS — Z111 Encounter for screening for respiratory tuberculosis: Secondary | ICD-10-CM

## 2020-09-22 DIAGNOSIS — E038 Other specified hypothyroidism: Secondary | ICD-10-CM

## 2020-09-23 LAB — BETA HCG QUANT (REF LAB): hCG Quant: <1 m[IU]/mL

## 2020-09-28 LAB — T4, FREE: Free T4: 1.26 ng/dL (ref 0.82–1.77)

## 2020-09-28 LAB — TSH: TSH: 4.46 u[IU]/mL (ref 0.450–4.500)

## 2020-09-28 LAB — QUANTIFERON-TB GOLD PLUS

## 2020-10-02 NOTE — Telephone Encounter (Signed)
redraw

## 2020-10-04 LAB — QUANTIFERON-TB GOLD PLUS
QuantiFERON Mitogen Value: >10 IU/mL
QuantiFERON Nil Value: 0.04 IU/mL
QuantiFERON TB1 Ag Value: 0.07 IU/mL
QuantiFERON TB2 Ag Value: 0.09 IU/mL
QuantiFERON-TB Gold Plus: NEGATIVE

## 2020-10-04 LAB — T4, FREE: Free T4: 1.19 ng/dL (ref 0.82–1.77)

## 2020-10-04 LAB — TSH: TSH: 5.95 u[IU]/mL — ABNORMAL HIGH (ref 0.450–4.500)

## 2020-10-05 ENCOUNTER — Ambulatory Visit (LOCAL_COMMUNITY_HEALTH_CENTER): Payer: 59

## 2020-10-05 ENCOUNTER — Other Ambulatory Visit: Payer: Self-pay

## 2020-10-05 DIAGNOSIS — Z23 Encounter for immunization: Secondary | ICD-10-CM

## 2020-10-05 NOTE — Progress Notes (Signed)
Client's immunization record entered into [facility] and sent for scanning. Per client, had Tdap 3 years ago but does not have copy of that vaccine with her. Jossie Ng, RN

## 2020-10-13 ENCOUNTER — Ambulatory Visit (INDEPENDENT_AMBULATORY_CARE_PROVIDER_SITE_OTHER): Payer: 59 | Admitting: Obstetrics and Gynecology

## 2020-10-13 ENCOUNTER — Other Ambulatory Visit: Payer: Self-pay

## 2020-10-13 ENCOUNTER — Encounter: Payer: Self-pay | Admitting: Obstetrics and Gynecology

## 2020-10-13 VITALS — BP 126/62 | HR 112 | Ht 61.0 in | Wt 237.0 lb

## 2020-10-13 DIAGNOSIS — Z01419 Encounter for gynecological examination (general) (routine) without abnormal findings: Secondary | ICD-10-CM | POA: Diagnosis not present

## 2020-10-13 DIAGNOSIS — Z113 Encounter for screening for infections with a predominantly sexual mode of transmission: Secondary | ICD-10-CM | POA: Diagnosis not present

## 2020-10-13 DIAGNOSIS — F32A Depression, unspecified: Secondary | ICD-10-CM

## 2020-10-13 DIAGNOSIS — F419 Anxiety disorder, unspecified: Secondary | ICD-10-CM | POA: Diagnosis not present

## 2020-10-13 DIAGNOSIS — Z3041 Encounter for surveillance of contraceptive pills: Secondary | ICD-10-CM

## 2020-10-13 MED ORDER — BUPROPION HCL ER (XL) 300 MG PO TB24: 300.0000 mg | ORAL_TABLET | Freq: Every day | ORAL | 3 refills | Status: DC

## 2020-10-13 NOTE — Progress Notes (Signed)
Gynecology Annual Exam   PCP: Patient, No Pcp Per (Inactive)  Chief Complaint:  Chief Complaint  Patient presents with   Gynecologic Exam    Annual - no concerns. RM 5    History of Present Illness: Patient is a 20 y.o. G0P0000 presents for annual exam. The patient has no complaints today.   LMP: No LMP recorded. (Menstrual status: Oral contraceptives). Average Interval: regular, 28 days Duration of flow: 5 days Heavy Menses: no Clots: no Intermenstrual Bleeding: no Postcoital Bleeding: no Dysmenorrhea: no  The patient is not currently sexually active. She currently uses OCP (estrogen/progesterone) for contraception. The patient does perform self breast exams.  There is no notable family history of breast or ovarian cancer in her family.  The patient wears seatbelts: yes.   The patient has regular exercise: not asked.    The patient reports current symptoms of depression.    Review of Systems: ROS  Past Medical History:  Patient Active Problem List   Diagnosis Date Noted   Symptomatic cholelithiasis 09/12/2020   Hypothyroidism 09/12/2020   Dysuria 09/13/2019   Urinary urgency 09/13/2019    Past Surgical History:  History reviewed. No pertinent surgical history.  Gynecologic History:  No LMP recorded. (Menstrual status: Oral contraceptives). Contraception: OCP (estrogen/progesterone) Last Pap: Results were: N/A under 21  Obstetric History: G0P0000  Family History:  Family History  Problem Relation Age of Onset   Lung cancer Maternal Grandfather    Breast cancer Paternal Grandmother 45       Malignant neoplasms, Estrogen Receptor positive    Social History:  Social History   Socioeconomic History   Marital status: Single    Spouse name: Not on file   Number of children: Not on file   Years of education: Not on file   Highest education level: Not on file  Occupational History   Not on file  Tobacco Use   Smoking status: Never   Smokeless  tobacco: Never  Vaping Use   Vaping Use: Never used  Substance and Sexual Activity   Alcohol use: No   Drug use: No   Sexual activity: Not Currently    Birth control/protection: Pill  Other Topics Concern   Not on file  Social History Narrative   Not on file   Social Determinants of Health   Financial Resource Strain: Not on file  Food Insecurity: Not on file  Transportation Needs: Not on file  Physical Activity: Not on file  Stress: Not on file  Social Connections: Not on file  Intimate Partner Violence: Not on file    Allergies:  Allergies  Allergen Reactions   Latex Itching and Swelling    Medications: Prior to Admission medications   Medication Sig Start Date End Date Taking? Authorizing Provider  buPROPion (WELLBUTRIN XL) 150 MG 24 hr tablet Take 1 tablet (150 mg total) by mouth daily. 09/05/20  Yes Vena Austria, MD  Levonorgest-Eth Est & Eth Est Nilda Calamity) 42-21-21-7 DAYS TABS Take 1 tablet by mouth daily. 07/13/20 10/12/20  Vena Austria, MD    Physical Exam Vitals: Blood pressure 126/62, pulse (!) 112, height 5\' 1"  (1.549 m), weight 237 lb (107.5 kg).  General: NAD HEENT: normocephalic, anicteric Thyroid: no enlargement, no palpable nodules Pulmonary: No increased work of breathing, CTAB Cardiovascular: RRR, distal pulses 2+ Breast: Breast symmetrical, no tenderness, no palpable nodules or masses, no skin or nipple retraction present, no nipple discharge.  No axillary or supraclavicular lymphadenopathy. Abdomen: NABS, soft, non-tender, non-distended.  Umbilicus without lesions.  No hepatomegaly, splenomegaly or masses palpable. No evidence of hernia  Genitourinary:  External: Normal external female genitalia.  Normal urethral meatus, normal Bartholin's and Skene's glands.    Vagina: Normal vaginal mucosa, no evidence of prolapse.    Cervix: Grossly normal in appearance, no bleeding  Uterus: Non-enlarged, mobile, normal contour.  No CMT  Adnexa: ovaries  non-enlarged, no adnexal masses  Rectal: deferred  Lymphatic: no evidence of inguinal lymphadenopathy Extremities: no edema, erythema, or tenderness Neurologic: Grossly intact Psychiatric: mood appropriate, affect full  Female chaperone present for pelvic and breast  portions of the physical exam  GAD 7 : Generalized Anxiety Score 09/20/2020 09/05/2020 07/13/2020 08/16/2019  Nervous, Anxious, on Edge 3 1 2 1   Control/stop worrying 2 2 2  0  Worry too much - different things 3 3 2 2   Trouble relaxing 1 1 1 1   Restless 0 1 1 0  Easily annoyed or irritable 3 3 2 1   Afraid - awful might happen 0 0 0 1  Total GAD 7 Score 12 11 10 6   Anxiety Difficulty Somewhat difficult Somewhat difficult Somewhat difficult Not difficult at all    Depression screen [facility] 2/9 09/20/2020 09/05/2020 07/13/2020 08/16/2019 04/20/2019  Decreased Interest 2 3 2 1 1   Down, Depressed, Hopeless 2 1 2 1 1   PHQ - 2 Score 4 4 4 2 2   Altered sleeping 0 3 3 1  0  Tired, decreased energy 1 2 3 3 3   Change in appetite 1 0 3 3 1   Feeling bad or failure about yourself  3 2 2 1 1   Trouble concentrating 0 1 0 0 0  Moving slowly or fidgety/restless 0 2 0 0 0  Suicidal thoughts 0 0 0 0 0  PHQ-9 Score 9 14 15 10 7   Difficult doing work/chores Not difficult at all Somewhat difficult Somewhat difficult Not difficult at all Somewhat difficult  Some recent data might be hidden     Assessment: 20 y.o. G0P0000 routine annual exam  Plan: Problem List Items Addressed This Visit   None Visit Diagnoses     Encounter for gynecological examination without abnormal finding    -  Primary   Routine screening for STI (sexually transmitted infection)       Anxiety and depression       Relevant Medications   buPROPion (WELLBUTRIN XL) 300 MG 24 hr tablet   Encounter for surveillance of contraceptive pills           1) STI screening  wasoffered and declined - negative GC/CT last month student health  2)  ASCCP guidelines and rational  discussed.  Patient opts for discontinue secondary to prior hysterectomy screening interval  3) Contraception - the patient is currently using  OCP (estrogen/progesterone).  She is happy with her current form of contraception and plans to continue  4) Routine healthcare maintenance including cholesterol, diabetes screening discussed Declines  5) Anxiety / Depression - Increase Wellbutrin to 300mg    5) Return in about 4 weeks (around 11/10/2020) for 4-6 week medication follow up phone .   11/05/2020, MD, 09/12/2020 [facility], [facility] 10/13/2020, 10:03 AM

## 2020-11-14 ENCOUNTER — Other Ambulatory Visit: Payer: Self-pay

## 2020-11-14 ENCOUNTER — Encounter
Admission: RE | Admit: 2020-11-14 | Discharge: 2020-11-14 | Disposition: A | Discharge status: Home / Self Care | Payer: 59 | Source: Ambulatory Visit | Attending: General Surgery | Admitting: General Surgery

## 2020-11-14 HISTORY — DX: Nodules of vocal cords: J38.2

## 2020-11-14 HISTORY — DX: Other chronic diseases of tonsils and adenoids: J35.8

## 2020-11-14 NOTE — Patient Instructions (Signed)
Your procedure is scheduled on: Friday November 24, 2020  Report to the Registration Desk on the 1st floor of the [facility] To find out your arrival time, please call [phone] between 1PM - 3PM on: Thursday November 23, 2020  REMEMBER: Instructions that are not followed completely may result in serious medical risk, up to and including death; or upon the discretion of your surgeon and anesthesiologist your surgery may need to be rescheduled.   DO NOT EAT OR DRINK after midnight the night before surgery.  No gum chewing, lozengers or hard candies.  TAKE THESE MEDICATIONS THE MORNING OF SURGERY WITH A SIP OF WATER: WELLBUTRIN QUARTETTE   One week prior to surgery: Stop Anti-inflammatories (NSAIDS) such as Advil, Aleve, Ibuprofen, Motrin, Naproxen, Naprosyn and  ASPIRIN OR Aspirin based products such as Excedrin, Goodys Powder, BC Powder. Stop ANY OVER THE COUNTER supplements until after surgery. You may however, continue to take Tylenol if needed for pain up until the day of surgery.  No Alcohol for 24 hours before or after surgery.  No Smoking including e-cigarettes for 24 hours prior to surgery.  No chewable tobacco products for at least 6 hours prior to surgery.  No nicotine patches on the day of surgery.  Do not use any "recreational" drugs for at least a week prior to your surgery.  Please be advised that the combination of cocaine and anesthesia may have negative outcomes, up to and including death. If you test positive for cocaine, your surgery will be cancelled.  On the morning of surgery brush your teeth with toothpaste and water, you may rinse your mouth with mouthwash if you wish. Do not swallow any toothpaste or mouthwash.  Do not wear jewelry, make-up, hairpins, clips or nail polish.  Do not wear lotions, powders, or perfumes.   Do not shave body from the neck down 48 hours prior to surgery just in case you cut yourself which could leave a site for infection.  Also,  freshly shaved skin may become irritated if using the CHG soap.  Contact lenses, hearing aids and dentures may not be worn into surgery.  Do not bring valuables to the hospital. [facility] is not responsible for any missing/lost belongings or valuables.   Use CHG Soap as directed on instruction sheet.  Notify your doctor if there is any change in your medical condition (cold, fever, infection).  Wear comfortable clothing (specific to your surgery type) to the hospital.  After surgery, you can help prevent lung complications by doing breathing exercises.  Take deep breaths and cough every 1-2 hours. Your doctor may order a device called an Incentive Spirometer to help you take deep breaths. When coughing or sneezing, hold a pillow firmly against your incision with both hands. This is called "splinting." Doing this helps protect your incision. It also decreases belly discomfort.  If you are being discharged the day of surgery, you will not be allowed to drive home. You will need a responsible adult (18 years or older) to drive you home and stay with you that night.   If you are taking public transportation, you will need to have a responsible adult (18 years or older) with you. Please confirm with your physician that it is acceptable to use public transportation.   Please call the Pre-admissions Testing Dept. at [phone] if you have any questions about these instructions.  Surgery Visitation Policy:  Patients undergoing a surgery or procedure may have one family member or support person  with them as long as that person is not COVID-19 positive or experiencing its symptoms.  That person may remain in the waiting area during the procedure.  Inpatient Visitation:    Visiting hours are 7 a.m. to 8 p.m. Inpatients will be allowed two visitors daily. The visitors may change each day during the patient's stay. No visitors under the age of 31. Any visitor under the age of 57 must be  accompanied by an adult. The visitor must pass COVID-19 screenings, use hand sanitizer when entering and exiting the patient's room and wear a mask at all times, including in the patient's room. Patients must also wear a mask when staff or their visitor are in the room. Masking is required regardless of vaccination status.

## 2020-11-16 ENCOUNTER — Other Ambulatory Visit: Payer: Self-pay | Admitting: General Surgery

## 2020-11-16 DIAGNOSIS — K8020 Calculus of gallbladder without cholecystitis without obstruction: Secondary | ICD-10-CM

## 2020-11-17 ENCOUNTER — Encounter: Payer: Self-pay | Admitting: General Surgery

## 2020-11-21 ENCOUNTER — Other Ambulatory Visit: Payer: Self-pay

## 2020-11-21 ENCOUNTER — Ambulatory Visit (INDEPENDENT_AMBULATORY_CARE_PROVIDER_SITE_OTHER): Payer: 59 | Admitting: Obstetrics and Gynecology

## 2020-11-21 DIAGNOSIS — F32A Depression, unspecified: Secondary | ICD-10-CM

## 2020-11-21 DIAGNOSIS — F419 Anxiety disorder, unspecified: Secondary | ICD-10-CM

## 2020-11-21 MED ORDER — VENLAFAXINE HCL ER 75 MG PO CP24: 75.0000 mg | ORAL_CAPSULE | Freq: Every day | ORAL | 3 refills | Status: DC
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
## 2020-11-24 ENCOUNTER — Ambulatory Visit: Payer: 59 | Admitting: Anesthesiology

## 2020-11-24 ENCOUNTER — Ambulatory Visit
Admission: RE | Admit: 2020-11-24 | Discharge: 2020-11-24 | Disposition: A | Discharge status: Home / Self Care | Payer: 59 | Attending: General Surgery | Admitting: General Surgery

## 2020-11-24 ENCOUNTER — Encounter
Admission: RE | Disposition: A | Discharge status: Home / Self Care | Payer: Self-pay | Source: Home / Self Care | Attending: General Surgery

## 2020-11-24 ENCOUNTER — Encounter: Payer: Self-pay | Admitting: General Surgery

## 2020-11-24 ENCOUNTER — Other Ambulatory Visit: Payer: Self-pay

## 2020-11-24 DIAGNOSIS — K219 Gastro-esophageal reflux disease without esophagitis: Secondary | ICD-10-CM | POA: Diagnosis not present

## 2020-11-24 DIAGNOSIS — Z79890 Hormone replacement therapy (postmenopausal): Secondary | ICD-10-CM | POA: Diagnosis not present

## 2020-11-24 DIAGNOSIS — K8010 Calculus of gallbladder with chronic cholecystitis without obstruction: Secondary | ICD-10-CM | POA: Insufficient documentation

## 2020-11-24 DIAGNOSIS — Z79899 Other long term (current) drug therapy: Secondary | ICD-10-CM | POA: Diagnosis not present

## 2020-11-24 DIAGNOSIS — E038 Other specified hypothyroidism: Secondary | ICD-10-CM | POA: Insufficient documentation

## 2020-11-24 DIAGNOSIS — K8020 Calculus of gallbladder without cholecystitis without obstruction: Secondary | ICD-10-CM

## 2020-11-24 DIAGNOSIS — Z793 Long term (current) use of hormonal contraceptives: Secondary | ICD-10-CM | POA: Insufficient documentation

## 2020-11-24 LAB — POCT PREGNANCY, URINE: Preg Test, Ur: NEGATIVE

## 2020-11-24 SURGERY — CHOLECYSTECTOMY, ROBOT-ASSISTED, LAPAROSCOPIC
Anesthesia: General

## 2020-11-24 MED ORDER — FENTANYL CITRATE (PF) 100 MCG/2ML IJ SOLN
INTRAMUSCULAR | Status: AC
  Filled 2020-11-24: qty 2

## 2020-11-24 MED ORDER — LIDOCAINE-EPINEPHRINE 1 %-1:100000 IJ SOLN
INTRAMUSCULAR | Status: DC | PRN
  Administered 2020-11-24: 22 mL via SUBCUTANEOUS

## 2020-11-24 MED ORDER — FAMOTIDINE 20 MG PO TABS
ORAL_TABLET | ORAL | Status: AC
  Administered 2020-11-24: 20 mg via ORAL
  Filled 2020-11-24: qty 1

## 2020-11-24 MED ORDER — CHLORHEXIDINE GLUCONATE 0.12 % MT SOLN
OROMUCOSAL | Status: AC
  Administered 2020-11-24: 15 mL via OROMUCOSAL
  Filled 2020-11-24: qty 15

## 2020-11-24 MED ORDER — IBUPROFEN 800 MG PO TABS: 800.0000 mg | ORAL_TABLET | Freq: Three times a day (TID) | ORAL | 0 refills | Status: DC | PRN

## 2020-11-24 MED ORDER — MIDAZOLAM HCL 2 MG/2ML IJ SOLN
INTRAMUSCULAR | Status: DC | PRN
  Administered 2020-11-24: 2 mg via INTRAVENOUS

## 2020-11-24 MED ORDER — ESMOLOL HCL 100 MG/10ML IV SOLN
INTRAVENOUS | Status: DC | PRN
  Administered 2020-11-24: 10 mg via INTRAVENOUS

## 2020-11-24 MED ORDER — SUGAMMADEX SODIUM 500 MG/5ML IV SOLN
INTRAVENOUS | Status: DC | PRN
  Administered 2020-11-24: 500 mg via INTRAVENOUS

## 2020-11-24 MED ORDER — GABAPENTIN 300 MG PO CAPS
ORAL_CAPSULE | ORAL | Status: AC
  Administered 2020-11-24: 300 mg via ORAL
  Filled 2020-11-24: qty 1

## 2020-11-24 MED ORDER — DEXMEDETOMIDINE (PRECEDEX) IN NS 20 MCG/5ML (4 MCG/ML) IV SYRINGE
PREFILLED_SYRINGE | INTRAVENOUS | Status: DC | PRN
  Administered 2020-11-24: 8 ug via INTRAVENOUS
  Administered 2020-11-24: 20 ug via INTRAVENOUS
  Administered 2020-11-24: 12 ug via INTRAVENOUS

## 2020-11-24 MED ORDER — OXYCODONE HCL 5 MG PO TABS: 5.0000 mg | ORAL_TABLET | Freq: Four times a day (QID) | ORAL | 0 refills | Status: DC | PRN

## 2020-11-24 MED ORDER — CHLORHEXIDINE GLUCONATE 0.12 % MT SOLN: 15.0000 mL | Freq: Once | OROMUCOSAL | Status: AC

## 2020-11-24 MED ORDER — OXYCODONE HCL 5 MG PO TABS
5.0000 mg | ORAL_TABLET | Freq: Once | ORAL | Status: AC
Start: 2020-11-24 — End: 2020-11-24
  Administered 2020-11-24: 5 mg via ORAL

## 2020-11-24 MED ORDER — GLYCOPYRROLATE 0.2 MG/ML IJ SOLN
INTRAMUSCULAR | Status: DC | PRN
  Administered 2020-11-24: .2 mg via INTRAVENOUS

## 2020-11-24 MED ORDER — DEXAMETHASONE SODIUM PHOSPHATE 10 MG/ML IJ SOLN
INTRAMUSCULAR | Status: DC | PRN
  Administered 2020-11-24: 10 mg via INTRAVENOUS

## 2020-11-24 MED ORDER — ACETAMINOPHEN 500 MG PO TABS
1000.0000 mg | ORAL_TABLET | ORAL | Status: AC
Start: 2020-11-24 — End: 2020-11-24

## 2020-11-24 MED ORDER — ACETAMINOPHEN 500 MG PO TABS
ORAL_TABLET | ORAL | Status: AC
  Administered 2020-11-24: 1000 mg via ORAL
  Filled 2020-11-24: qty 2

## 2020-11-24 MED ORDER — FAMOTIDINE 20 MG PO TABS: 20.0000 mg | ORAL_TABLET | Freq: Once | ORAL | Status: AC

## 2020-11-24 MED ORDER — CHLORHEXIDINE GLUCONATE CLOTH 2 % EX PADS: 6.0000 | MEDICATED_PAD | Freq: Once | CUTANEOUS | Status: DC

## 2020-11-24 MED ORDER — ONDANSETRON HCL 4 MG/2ML IJ SOLN: 4.0000 mg | Freq: Once | INTRAMUSCULAR | Status: AC | PRN

## 2020-11-24 MED ORDER — ROCURONIUM BROMIDE 100 MG/10ML IV SOLN
INTRAVENOUS | Status: DC | PRN
  Administered 2020-11-24: 10 mg via INTRAVENOUS
  Administered 2020-11-24: 50 mg via INTRAVENOUS

## 2020-11-24 MED ORDER — LIDOCAINE-EPINEPHRINE 1 %-1:100000 IJ SOLN
INTRAMUSCULAR | Status: AC
  Filled 2020-11-24: qty 1

## 2020-11-24 MED ORDER — FENTANYL CITRATE (PF) 100 MCG/2ML IJ SOLN: 25.0000 ug | INTRAMUSCULAR | Status: DC | PRN

## 2020-11-24 MED ORDER — PROPOFOL 10 MG/ML IV BOLUS
INTRAVENOUS | Status: DC | PRN
  Administered 2020-11-24: 200 mg via INTRAVENOUS
  Administered 2020-11-24: 30 mg via INTRAVENOUS

## 2020-11-24 MED ORDER — CELECOXIB 200 MG PO CAPS: 200.0000 mg | ORAL_CAPSULE | ORAL | Status: AC

## 2020-11-24 MED ORDER — SODIUM CHLORIDE 0.9 % IV SOLN
INTRAVENOUS | Status: AC
  Filled 2020-11-24: qty 2

## 2020-11-24 MED ORDER — FENTANYL CITRATE (PF) 100 MCG/2ML IJ SOLN
INTRAMUSCULAR | Status: DC | PRN
  Administered 2020-11-24 (×4): 50 ug via INTRAVENOUS

## 2020-11-24 MED ORDER — CELECOXIB 200 MG PO CAPS
ORAL_CAPSULE | ORAL | Status: AC
  Administered 2020-11-24: 200 mg via ORAL
  Filled 2020-11-24: qty 1

## 2020-11-24 MED ORDER — PHENYLEPHRINE HCL (PRESSORS) 10 MG/ML IV SOLN
INTRAVENOUS | Status: AC
  Filled 2020-11-24: qty 1

## 2020-11-24 MED ORDER — MIDAZOLAM HCL 2 MG/2ML IJ SOLN
INTRAMUSCULAR | Status: AC
  Filled 2020-11-24: qty 2

## 2020-11-24 MED ORDER — OXYCODONE HCL 5 MG PO TABS
ORAL_TABLET | ORAL | Status: AC
  Filled 2020-11-24: qty 1

## 2020-11-24 MED ORDER — INDOCYANINE GREEN 25 MG IV SOLR
7.5000 mg | Freq: Once | INTRAVENOUS | Status: AC
  Administered 2020-11-24: 7.5 mg via INTRAVENOUS
  Filled 2020-11-24: qty 3

## 2020-11-24 MED ORDER — GABAPENTIN 300 MG PO CAPS: 300.0000 mg | ORAL_CAPSULE | ORAL | Status: AC

## 2020-11-24 MED ORDER — ONDANSETRON HCL 4 MG/2ML IJ SOLN
INTRAMUSCULAR | Status: AC
  Administered 2020-11-24: 4 mg via INTRAVENOUS
  Filled 2020-11-24: qty 2

## 2020-11-24 MED ORDER — ACETAMINOPHEN 500 MG PO TABS: 1000.0000 mg | ORAL_TABLET | Freq: Four times a day (QID) | ORAL | 0 refills | Status: AC | PRN

## 2020-11-24 MED ORDER — LACTATED RINGERS IV SOLN: INTRAVENOUS | Status: DC

## 2020-11-24 MED ORDER — SODIUM CHLORIDE 0.9 % IV SOLN
2.0000 g | INTRAVENOUS | Status: AC
  Administered 2020-11-24: 2 g via INTRAVENOUS

## 2020-11-24 MED ORDER — ONDANSETRON HCL 4 MG/2ML IJ SOLN
INTRAMUSCULAR | Status: DC | PRN
  Administered 2020-11-24: 4 mg via INTRAVENOUS

## 2020-11-24 MED ORDER — CHLORHEXIDINE GLUCONATE CLOTH 2 % EX PADS
6.0000 | MEDICATED_PAD | Freq: Once | CUTANEOUS | Status: AC
Start: 2020-11-24 — End: 2020-11-24
  Administered 2020-11-24: 6 via TOPICAL

## 2020-11-24 MED ORDER — LIDOCAINE HCL (CARDIAC) PF 100 MG/5ML IV SOSY
PREFILLED_SYRINGE | INTRAVENOUS | Status: DC | PRN
  Administered 2020-11-24: 100 mg via INTRAVENOUS

## 2020-11-24 MED ORDER — SUCCINYLCHOLINE CHLORIDE 20 MG/ML IJ SOLN
INTRAMUSCULAR | Status: DC | PRN
  Administered 2020-11-24: 120 mg via INTRAVENOUS

## 2020-11-24 MED ORDER — MEPERIDINE HCL 25 MG/ML IJ SOLN: 6.2500 mg | INTRAMUSCULAR | Status: DC | PRN

## 2020-11-24 MED ORDER — PHENYLEPHRINE HCL (PRESSORS) 10 MG/ML IV SOLN
INTRAVENOUS | Status: DC | PRN
  Administered 2020-11-24 (×2): 200 ug via INTRAVENOUS
  Administered 2020-11-24 (×4): 100 ug via INTRAVENOUS

## 2020-11-24 MED ORDER — BUPIVACAINE HCL (PF) 0.25 % IJ SOLN
INTRAMUSCULAR | Status: AC
  Filled 2020-11-24: qty 30

## 2020-11-24 MED ORDER — ORAL CARE MOUTH RINSE: 15.0000 mL | Freq: Once | OROMUCOSAL | Status: AC

## 2020-11-24 SURGICAL SUPPLY — 61 items
ADH SKN CLS APL DERMABOND .7 (GAUZE/BANDAGES/DRESSINGS) ×2
APL PRP STRL LF DISP 70% ISPRP (MISCELLANEOUS) ×2
BAG INFUSER PRESSURE 100CC (MISCELLANEOUS) ×3 IMPLANT
BAG SPEC RTRVL LRG 6X4 10 (ENDOMECHANICALS) ×2
BLADE SURG SZ11 CARB STEEL (BLADE) ×3 IMPLANT
CANISTER SUCT 1200ML W/VALVE (MISCELLANEOUS) IMPLANT
CANNULA REDUC XI 12-8 STAPL (CANNULA) ×1
CANNULA REDUCER 12-8 DVNC XI (CANNULA) ×2 IMPLANT
CHLORAPREP W/TINT 26 (MISCELLANEOUS) ×3 IMPLANT
CLIP VESOLOCK MED LG 6/CT (CLIP) ×9 IMPLANT
COVER TIP SHEARS 8 DVNC (MISCELLANEOUS) ×2 IMPLANT
COVER TIP SHEARS 8MM DA VINCI (MISCELLANEOUS) ×1
DECANTER SPIKE VIAL GLASS SM (MISCELLANEOUS) ×6 IMPLANT
DEFOGGER SCOPE WARMER CLEARIFY (MISCELLANEOUS) ×3 IMPLANT
DERMABOND ADVANCED (GAUZE/BANDAGES/DRESSINGS) ×1
DERMABOND ADVANCED .7 DNX12 (GAUZE/BANDAGES/DRESSINGS) ×2 IMPLANT
DRAPE ARM DVNC X/XI (DISPOSABLE) ×8 IMPLANT
DRAPE COLUMN DVNC XI (DISPOSABLE) ×2 IMPLANT
DRAPE DA VINCI XI ARM (DISPOSABLE) ×4
DRAPE DA VINCI XI COLUMN (DISPOSABLE) ×1
ELECT CAUTERY BLADE TIP 2.5 (TIP) ×3
ELECT REM PT RETURN 9FT ADLT (ELECTROSURGICAL) ×3
ELECTRODE CAUTERY BLDE TIP 2.5 (TIP) ×2 IMPLANT
ELECTRODE REM PT RTRN 9FT ADLT (ELECTROSURGICAL) ×2 IMPLANT
GAUZE 4X4 16PLY ~~LOC~~+RFID DBL (SPONGE) ×3 IMPLANT
GLOVE SURG SYN 6.5 ES PF (GLOVE) ×12 IMPLANT
GLOVE SURG UNDER LTX SZ7 (GLOVE) ×12 IMPLANT
GOWN STRL REUS W/ TWL LRG LVL3 (GOWN DISPOSABLE) ×6 IMPLANT
GOWN STRL REUS W/TWL LRG LVL3 (GOWN DISPOSABLE) ×9
GRASPER SUT TROCAR 14GX15 (MISCELLANEOUS) IMPLANT
IRRIGATOR SUCT 8 DISP DVNC XI (IRRIGATION / IRRIGATOR) ×2 IMPLANT
IRRIGATOR SUCTION 8MM XI DISP (IRRIGATION / IRRIGATOR) ×1
IV NS 1000ML (IV SOLUTION) ×3
IV NS 1000ML BAXH (IV SOLUTION) ×2 IMPLANT
KIT PINK PAD W/HEAD ARE REST (MISCELLANEOUS) ×3
KIT PINK PAD W/HEAD ARM REST (MISCELLANEOUS) ×2 IMPLANT
LABEL OR SOLS (LABEL) ×3 IMPLANT
MANIFOLD NEPTUNE II (INSTRUMENTS) ×3 IMPLANT
NEEDLE HYPO 22GX1.5 SAFETY (NEEDLE) ×3 IMPLANT
NEEDLE INSUFFLATION 14GA 120MM (NEEDLE) IMPLANT
NS IRRIG 500ML POUR BTL (IV SOLUTION) ×3 IMPLANT
OBTURATOR OPTICAL STANDARD 8MM (TROCAR) ×1
OBTURATOR OPTICAL STND 8 DVNC (TROCAR) ×2
OBTURATOR OPTICALSTD 8 DVNC (TROCAR) ×2 IMPLANT
PACK LAP CHOLECYSTECTOMY (MISCELLANEOUS) ×3 IMPLANT
PENCIL ELECTRO HAND CTR (MISCELLANEOUS) ×3 IMPLANT
POUCH SPECIMEN RETRIEVAL 10MM (ENDOMECHANICALS) ×3 IMPLANT
SEAL CANN UNIV 5-8 DVNC XI (MISCELLANEOUS) ×6 IMPLANT
SEAL XI 5MM-8MM UNIVERSAL (MISCELLANEOUS) ×3
SET TUBE SMOKE EVAC HIGH FLOW (TUBING) ×3 IMPLANT
SOLUTION ELECTROLUBE (MISCELLANEOUS) ×3 IMPLANT
STAPLER CANNULA SEAL DVNC XI (STAPLE) ×2 IMPLANT
STAPLER CANNULA SEAL XI (STAPLE) ×1
STRIP CLOSURE SKIN 1/2X4 (GAUZE/BANDAGES/DRESSINGS) ×3 IMPLANT
SUT MNCRL 4-0 (SUTURE) ×3
SUT MNCRL 4-0 27XMFL (SUTURE) ×2
SUT VIC AB 3-0 SH 27 (SUTURE)
SUT VIC AB 3-0 SH 27X BRD (SUTURE) IMPLANT
SUT VICRYL 0 AB UR-6 (SUTURE) IMPLANT
SUTURE MNCRL 4-0 27XMF (SUTURE) ×2 IMPLANT
TROCAR XCEL NON-BLD 5MMX100MML (ENDOMECHANICALS) IMPLANT

## 2020-11-24 NOTE — Discharge Instructions (Signed)
AMBULATORY SURGERY  ?DISCHARGE INSTRUCTIONS ? ? ?The drugs that you were given will stay in your system until tomorrow so for the next 24 hours you should not: ? ?Drive an automobile ?Make any legal decisions ?Drink any alcoholic beverage ? ? ?You may resume regular meals tomorrow.  Today it is better to start with liquids and gradually work up to solid foods. ? ?You may eat anything you prefer, but it is better to start with liquids, then soup and crackers, and gradually work up to solid foods. ? ? ?Please notify your doctor immediately if you have any unusual bleeding, trouble breathing, redness and pain at the surgery site, drainage, fever, or pain not relieved by medication. ? ? ? ?Additional Instructions: ? ? ? ?Please contact your physician with any problems or Same Day Surgery at [phone], Monday through Friday 6 am to 4 pm, or [facility] [place] Main number at [phone].  ?

## 2020-11-24 NOTE — Transfer of Care (Signed)
Immediate Anesthesia Transfer of Care Note  Patient: Tina [name]  Procedure(s) Performed: XI ROBOTIC ASSISTED LAPAROSCOPIC CHOLECYSTECTOMY  Patient Location: PACU  Anesthesia Type:General  Level of Consciousness: drowsy and patient cooperative  Airway & Oxygen Therapy: Patient Spontanous Breathing and Patient connected to face mask oxygen  Post-op Assessment: Report given to RN and Post -op Vital signs reviewed and stable  Post vital signs: Reviewed and stable  Last Vitals:  Vitals Value Taken Time  BP 129/53 11/24/20 0920  Temp 36.5 C 11/24/20 0920  Pulse 87 11/24/20 0923  Resp 13 11/24/20 0923  SpO2 100 % 11/24/20 0923  Vitals shown include unvalidated device data.  Last Pain:  Vitals:   11/24/20 0628  TempSrc: Tympanic  PainSc: 0-No pain         Complications: No notable events documented.

## 2020-11-24 NOTE — Anesthesia Postprocedure Evaluation (Signed)
Anesthesia Post Note  Patient: Natosha A [place]  Procedure(s) Performed: XI ROBOTIC ASSISTED LAPAROSCOPIC CHOLECYSTECTOMY  Patient location during evaluation: PACU Anesthesia Type: General Level of consciousness: awake and alert, awake and oriented Pain management: pain level controlled Vital Signs Assessment: post-procedure vital signs reviewed and stable Respiratory status: spontaneous breathing, nonlabored ventilation and respiratory function stable Cardiovascular status: blood pressure returned to baseline and stable Postop Assessment: no apparent nausea or vomiting Anesthetic complications: no   No notable events documented.   Last Vitals:  Vitals:   11/24/20 1012 11/24/20 1041  BP: (!) 106/54 99/66  Pulse: 71 82  Resp: 18   Temp: (!) 36.2 C   SpO2: 100% 100%    Last Pain:  Vitals:   11/24/20 1041  TempSrc:   PainSc: 1                  Manfred Arch

## 2020-11-24 NOTE — H&P (Signed)
Chief Complaint  Patient presents with   New Patient (Initial Visit)      Gallbladder        HPI Tina [name] is a 20 y.o. female.   She has been referred by her primary care provider for surgical evaluation of symptomatic cholelithiasis.  Ms. [name] states that when she was on spring break in March, she had severe abdominal pain, localized to the right upper quadrant that occurred on 2 consecutive nights, both after eating fatty foods (pizza, [place] food).  She said it was so severe, that she was doubled up in pain.  It was localized to the right upper quadrant and did not radiate.  The only thing that made it better was time.  She had nausea and vomiting with these episodes, as well as subjective fevers and chills.  She did not have any diarrhea.  She has altered her diet since that time, and has not had further episodes.  She does endorse significant heartburn/acid reflux for which she takes omeprazole on an as-needed basis.  She has never had jaundice or pancreatitis.  No acholic stools, or dark, Coca-Cola-colored urine.  2 weeks ago, her primary care provider ordered an ultrasound which demonstrated cholelithiasis without concern for cholecystitis.  She is here today to discuss the possibility of cholecystectomy.  She says that multiple family members have required the operation.         Past Medical History:  Diagnosis Date   Anemia     Anxiety     Depression     GERD (gastroesophageal reflux disease)     Thyroid disease        History reviewed. No pertinent surgical history.        Family History  Problem Relation Age of Onset   Lung cancer Maternal Grandfather     Breast cancer Paternal Grandmother 29        Malignant neoplasms, Estrogen Receptor positive      Social History Social History        Tobacco Use   Smoking status: Never Smoker   Smokeless tobacco: Never Used  Building services engineer Use: Never used  Substance Use Topics   Alcohol use: No   Drug use:  No      No Known Allergies         Current Outpatient Medications  Medication Sig Dispense Refill   buPROPion (WELLBUTRIN XL) 150 MG 24 hr tablet Take 1 tablet (150 mg total) by mouth daily. 30 tablet 2   Levonorgest-Eth Est & Eth Est (QUARTETTE) 42-21-21-7 DAYS TABS Take 1 tablet by mouth daily. 91 tablet 3   levothyroxine (SYNTHROID) 25 MCG tablet Take 1 tablet (25 mcg total) by mouth daily before breakfast. 30 tablet 3    No current facility-administered medications for this visit.      Review of Systems Review of Systems  All other systems reviewed and are negative. Or as discussed in history of present illness.   Today's Vitals   11/24/20 0628  BP: 123/68  Pulse: 86  Resp: 18  Temp: (!) 97.5 F (36.4 C)  TempSrc: Tympanic  SpO2: 100%  Weight: 106.6 kg  Height: 5\' 1"  (1.549 m)  PainSc: 0-No pain   Body mass index is 44.4 kg/m.    Physical Exam Physical Exam Constitutional:      General: She is not in acute distress.    Appearance: She is obese.  HENT:    Head: Normocephalic and atraumatic.  Nose:     Comments: Covered with a mask    Mouth/Throat:     Comments: Covered with a mask Eyes:    General: No scleral icterus.       Right eye: No discharge.        Left eye: No discharge.  Neck:    Comments: No palpable cervical or supraclavicular lymphadenopathy.  The trachea is midline.  No dominant thyroid masses or thyromegaly appreciated.  The gland moves freely with deglutition. Cardiovascular:    Rate and Rhythm: Normal rate and regular rhythm.     Pulses: Normal pulses.  Pulmonary:    Effort: Pulmonary effort is normal.     Breath sounds: Normal breath sounds.  Abdominal:     General: There is no distension.     Palpations: Abdomen is soft.     Tenderness: There is no abdominal tenderness. There is no guarding or rebound.     Comments: Protuberant, consistent with her level of obesity.  Murphy sign is negative.  Genitourinary:    Comments:  Deferred Musculoskeletal:        General: No swelling or tenderness. Skin:    General: Skin is warm and dry.  Neurological:    General: No focal deficit present.     Mental Status: She is alert and oriented to person, place, and time.  Psychiatric:        Mood and Affect: Mood normal.        Behavior: Behavior normal.       Data Reviewed I reviewed a number of faxed clinic notes from [facility] practices, located in [place].  The initial visit on August 24, 2020 was a visit to establish care and to follow-up from the emergency department from her abdominal pain.  Pertinent components of this visit include the report of cholelithiasis without cholecystitis seen on abdominal ultrasound and physical exam findings that included a negative Murphy sign.   I also reviewed Dr. Ramiro Harvest note from Sep 05, 2020.  This was a telephone visit for depression and anxiety.  Based upon her lack of improvement with BuSpar and Lexapro, she was switched to Wellbutrin XL.   Results for [name], Zhaniya A (MRN [id number]) as of 09/12/2020 12:29   Ref. Range 12/11/2016 11:22 04/20/2019 14:08 07/14/2020 11:51  TSH Latest Ref Range: 0.450 - 4.500 uIU/mL 3.900 4.050 5.410 (H)  T4,Free(Direct) Latest Ref Range: 0.82 - 1.77 ng/dL     8.24  These labs show a progressive increase in her TSH, suggesting impending clinical hypothyroidism, but her current free T4 is still within normal range.     Assessment This is a 20 year old woman who has had 2 attacks of symptomatic cholelithiasis.  Both occurred in relationship to fatty food consumption.  I discussed with her that via dietary modification, it is possible that she might never have another incident, due to her family history, however, she is interested in having her gallbladder removed.   She is also mildly hypothyroid, currently compensated.   Plan I have offered her a robot-assisted laparoscopic cholecystectomy.  I discussed the  procedure in detail.  We discussed the risks and benefits of a laparoscopic cholecystectomy and possible cholangiogram including, but not limited to: bleeding, infection, injury to surrounding structures such as the intestine or liver, bile leak, retained gallstones, need to convert to an open procedure, prolonged diarrhea, blood clots such as DVT, common bile duct injury, anesthesia risks, and possible need for additional procedures. The patient had the  opportunity to ask any questions and these were answered to her satisfaction.   Additionally, I have prescribed levothyroxine 25 mcg daily to address her subclinical hypothyroidism, which may actually be mildly clinical, manifesting primarily as fatigue.  We will proceed with surgery today.

## 2020-11-24 NOTE — Anesthesia Preprocedure Evaluation (Signed)
Anesthesia Evaluation  Patient identified by MRN, date of birth, ID band Patient awake    Reviewed: Allergy & Precautions, NPO status , Patient's Chart, lab work & pertinent test results  Airway Mallampati: II  TM Distance: >3 FB Neck ROM: Full    Dental no notable dental hx.    Pulmonary neg pulmonary ROS,    Pulmonary exam normal        Cardiovascular negative cardio ROS Normal cardiovascular exam     Neuro/Psych PSYCHIATRIC DISORDERS Anxiety Depression negative neurological ROS     GI/Hepatic Neg liver ROS, GERD  ,  Endo/Other  Hypothyroidism Morbid obesity  Renal/GU negative Renal ROS  negative genitourinary   Musculoskeletal negative musculoskeletal ROS (+)   Abdominal (+) + obese,   Peds negative pediatric ROS (+)  Hematology negative hematology ROS (+) anemia ,   Anesthesia Other Findings Anemia    Anxiety    Depression    GERD Thyroid disease    Tonsil stone    Vocal cord nodule Assymptomatic       Reproductive/Obstetrics negative OB ROS                            Anesthesia Physical Anesthesia Plan  ASA: 2  Anesthesia Plan: General   Post-op Pain Management:    Induction: Intravenous  PONV Risk Score and Plan: 3 and Propofol infusion, Ondansetron, Midazolam and Dexamethasone  Airway Management Planned: Oral ETT  Additional Equipment:   Intra-op Plan:   Post-operative Plan:   Informed Consent: I have reviewed the patients History and Physical, chart, labs and discussed the procedure including the risks, benefits and alternatives for the proposed anesthesia with the patient or authorized representative who has indicated his/her understanding and acceptance.       Plan Discussed with: CRNA, Anesthesiologist and Surgeon  Anesthesia Plan Comments:         Anesthesia Quick Evaluation

## 2020-11-24 NOTE — Op Note (Addendum)
Operative note  Pre-operative Diagnosis: Symptomatic cholelithiasis  Post-operative Diagnosis: Same  Procedure: Robot assisted laparoscopic cholecystectomy with intraoperative indocyanine green cholangiography  Surgeon: Duanne Guess, MD  Anesthesia: GETA  Findings: Normal biliary anatomy, confirmed by intraoperative ICG cholangiography.  Several stones within the gallbladder.  No inflammation to suggest acute cholecystitis.  Estimated Blood Loss: Less than 5 cc       Specimens: Gallbladder           Complications: none immediately apparent  Procedure In Detail: The patient was seen again in the holding room. The benefits, complications, treatment options, and expected outcomes were discussed with the patient. The risks of bleeding, infection, recurrence of symptoms, failure to resolve symptoms, bile duct damage, bile duct leak, retained common bile duct stone, bowel injury, any of which could require further surgery and/or ERCP, stent, or papillotomy were reviewed with the patient. The likelihood of improving the patient's symptoms with return to their baseline status is good.  The patient and/or family concurred with the proposed plan, giving informed consent.  The patient was taken to operating room, identified and the procedure verified as laparoscopic cholecystectomy.   A time out was held and the above information confirmed.  Prior to the induction of general anesthesia, antibiotic prophylaxis was administered. VTE prophylaxis was in place. General endotracheal anesthesia was then administered and tolerated well. After the induction, the abdomen was prepped with Chloraprep and draped in the sterile fashion. The patient was positioned in the supine position.  Optiview technique was used to enter the abdomen in the left upper quadrant via a standard 5 mm laparoscopic trocar.  Pneumoperitoneum was then created with CO2 and tolerated well without any adverse changes in the patient's  vital signs.  A 12 mm robotic trocar and two 8-mm robotic trocars were placed in the lower abdomen under direct vision and the standard 5 mm trocar was exchanged for an 8 mm robotic trocar.  All skin incisions were infiltrated with a local anesthetic agent before making the incision and placing the trocars.   The patient was positioned in 15 degrees of reverse Trendelenburg and tilted 10 degrees to the left.  The robot was brought to the surgical field and docked in the standard fashion.  We made sure all the instrumentation was kept in direct view at all times and that there were no collision between the arms.  I scrubbed out and went to the console.  The gallbladder was identified, the fundus grasped and retracted cephalad. Adhesions were lysed bluntly. The infundibulum was grasped and retracted laterally, exposing the peritoneum overlying the triangle of Calot. This was then divided and exposed in a blunt fashion. An extended critical view of the cystic duct and cystic artery was obtained.  The cystic duct was clearly identified and bluntly dissected away from the surrounding tissues, as was the cystic artery.  Using ICG cholangiography we visualized the cystic duct and confirmed that there was no aberrant biliary ductal anatomy nor any evidence of bile duct injury.  Both cystic duct and cystic artery were clipped and divided. The gallbladder was taken from the gallbladder fossa in a retrograde fashion with the electrocautery. Hemostasis was achieved with the electrocautery. Inspection of the right upper quadrant was performed. No bleeding, bile duct injury or leak was noted.  There was a small amount of bruising on the serosa of the small bowel.  This was carefully inspected and appeared to be quite limited and did not warrant further intervention.  The gallbladder  was then placed in an Endopouch bag. The robotic instruments were removed and robotic arms were undocked in the standard fashion.    I  scrubbed back in.  The gallbladder was removed via the 12 mm trocar site.  Due to the degree of skiving that was necessary to place this port, in order to avoid the patient's bowel, the fascial defects did not line up and I elected to not close the fascia.  The remaining 8 mm ports were removed and pneumoperitoneum was released.  Each port site was closed with deep dermal 3-0 Vicryl.  4-0 subcuticular Monocryl was used to close the skin. Dermabond was applied, followed by Steri-Strips.  The patient was then awakened, extubated, and taken to the postanesthesia recovery unit in stable condition.   Sponge, lap, and needle counts were reported to be correct number at closure and at the conclusion of the case.          Duanne Guess, MD FACS

## 2020-11-24 NOTE — Anesthesia Procedure Notes (Signed)
Procedure Name: Intubation Date/Time: 11/24/2020 7:34 AM Performed by: Mohammed Kindle, CRNA Pre-anesthesia Checklist: Patient identified, Emergency Drugs available, Suction available and Patient being monitored Patient Re-evaluated:Patient Re-evaluated prior to induction Oxygen Delivery Method: Circle system utilized Preoxygenation: Pre-oxygenation with 100% oxygen Induction Type: IV induction Ventilation: Mask ventilation without difficulty Laryngoscope Size: McGraph and 3 Grade View: Grade I Tube type: Oral Tube size: 7.0 mm Number of attempts: 1 Airway Equipment and Method: Stylet and Oral airway Placement Confirmation: ETT inserted through vocal cords under direct vision, positive ETCO2, breath sounds checked- equal and bilateral and CO2 detector Secured at: 21 cm Tube secured with: Tape Dental Injury: Teeth and Oropharynx as per pre-operative assessment

## 2020-11-27 ENCOUNTER — Encounter: Payer: Self-pay | Admitting: General Surgery

## 2020-11-27 LAB — SURGICAL PATHOLOGY

## 2020-12-08 ENCOUNTER — Encounter: Payer: 59 | Admitting: Physician Assistant

## 2020-12-11 ENCOUNTER — Ambulatory Visit (INDEPENDENT_AMBULATORY_CARE_PROVIDER_SITE_OTHER): Payer: 59 | Admitting: Obstetrics and Gynecology

## 2020-12-11 DIAGNOSIS — F411 Generalized anxiety disorder: Secondary | ICD-10-CM | POA: Diagnosis not present

## 2020-12-11 MED ORDER — VENLAFAXINE HCL ER 75 MG PO CP24: 75.0000 mg | ORAL_CAPSULE | Freq: Every day | ORAL | 3 refills | Status: DC

## 2020-12-11 NOTE — Progress Notes (Signed)
I connected with Tina [name] on 12/11/20 at 11:30 AM EDT by telephone and verified that I am speaking with the correct person using two identifiers.   I discussed the limitations, risks, security and privacy concerns of performing an evaluation and management service by telephone and the availability of in person appointments. I also discussed with the patient that there may be a patient responsible charge related to this service. The patient expressed understanding and agreed to proceed.  The patient was at home I spoke with the patient from my workstation phone The names of people involved in this encounter were: Tina [name] , and Tina [name]   Obstetrics & Gynecology Office Visit   Chief Complaint:  Chief Complaint  Patient presents with   Follow-up    Phone visit - no concerns.     History of Present Illness: The patient is a 20 y.o. female presenting follow up for symptoms of anxiety.  The patient is currently taking Effexor XR 75mg  for the management of her symptoms.  She has not had any recent situational stressors.  She repors overall good improvement in symptoms.  She denies anhedonia, insomnia, risk taking behavior, irritability, social anxiety, agorophobia, feelings of guilt, feelings of worthlessness, suicidal ideation, homicidal ideation, auditory hallucinations, and visual hallucinations. Symptoms have improved since last visit.      Review of Systems: review of systems negative unless noted in HPI  Past Medical History:  Past Medical History:  Diagnosis Date   Anemia    Anxiety    Depression    GERD (gastroesophageal reflux disease)    Thyroid disease    Tonsil stone    Vocal cord nodule     Past Surgical History:  Past Surgical History:  Procedure Laterality Date   NO PAST SURGERIES      Gynecologic History: No LMP recorded. (Menstrual status: Oral contraceptives).  Obstetric History: G0P0000  Family History:  Family History  Problem  Relation Age of Onset   Lung cancer Maternal Grandfather    Breast cancer Paternal Grandmother 18       Malignant neoplasms, Estrogen Receptor positive    Social History:  Social History   Socioeconomic History   Marital status: Single    Spouse name: Not on file   Number of children: Not on file   Years of education: Not on file   Highest education level: Not on file  Occupational History   Not on file  Tobacco Use   Smoking status: Never   Smokeless tobacco: Never  Vaping Use   Vaping Use: Never used  Substance and Sexual Activity   Alcohol use: No   Drug use: No   Sexual activity: Not Currently    Birth control/protection: Pill  Other Topics Concern   Not on file  Social History Narrative   Not on file   Social Determinants of Health   Financial Resource Strain: Not on file  Food Insecurity: Not on file  Transportation Needs: Not on file  Physical Activity: Not on file  Stress: Not on file  Social Connections: Not on file  Intimate Partner Violence: Not on file    Allergies:  Allergies  Allergen Reactions   Latex Itching and Swelling    Medications: Prior to Admission medications   Medication Sig Start Date End Date Taking? Authorizing Provider  venlafaxine XR (EFFEXOR-XR) 75 MG 24 hr capsule Take 1 capsule (75 mg total) by mouth daily. 11/21/20  Yes 11/23/20, MD  buPROPion [facility]  XL) 300 MG 24 hr tablet Take 1 tablet (300 mg total) by mouth daily. Patient not taking: Reported on 12/11/2020 10/13/20   Tina Austria, MD  ibuprofen (ADVIL) 800 MG tablet Take 1 tablet (800 mg total) by mouth every 8 (eight) hours as needed. 11/24/20   Duanne Guess, MD  Levonorgest-Eth Est & Eth Est Nilda Calamity) 42-21-21-7 DAYS TABS Take 1 tablet by mouth daily. 07/13/20 11/14/20  Tina Austria, MD  oxyCODONE (OXY IR/ROXICODONE) 5 MG immediate release tablet Take 1 tablet (5 mg total) by mouth every 6 (six) hours as needed for severe pain. 11/24/20   Duanne Guess, MD    Physical Exam Vitals: There were no vitals filed for this visit. No LMP recorded. (Menstrual status: Oral contraceptives).  General: NAD HEENT: normocephalic, anicteric Pulmonary: No increased work of breathing Neurologic: Grossly intact Psychiatric: mood appropriate, affect full    GAD 7 : Generalized Anxiety Score 12/11/2020 11/21/2020 09/20/2020 09/05/2020  Nervous, Anxious, on Edge 2 3 3 1   Control/stop worrying 1 3 2 2   Worry too much - different things 1 3 3 3   Trouble relaxing 1 3 1 1   Restless 0 1 0 1  Easily annoyed or irritable 1 3 3 3   Afraid - awful might happen 1 1 0 0  Total GAD 7 Score 7 17 12 11   Anxiety Difficulty Somewhat difficult Very difficult Somewhat difficult Somewhat difficult    Depression screen [facility] 2/9 12/11/2020 11/21/2020 09/20/2020  Decreased Interest 1 2 2   Down, Depressed, Hopeless 1 2 2   PHQ - 2 Score 2 4 4   Altered sleeping 1 0 0  Tired, decreased energy 2 1 1   Change in appetite 1 2 1   Feeling bad or failure about yourself  1 3 3   Trouble concentrating 2 1 0  Moving slowly or fidgety/restless 0 0 0  Suicidal thoughts 0 0 0  PHQ-9 Score 9 11 9   Difficult doing work/chores Somewhat difficult Somewhat difficult Not difficult at all  Some recent data might be hidden    Depression screen [facility] 2/9 12/11/2020 11/21/2020 09/20/2020 09/05/2020 07/13/2020  Decreased Interest 1 2 2 3 2   Down, Depressed, Hopeless 1 2 2 1 2   PHQ - 2 Score 2 4 4 4 4   Altered sleeping 1 0 0 3 3  Tired, decreased energy 2 1 1 2 3   Change in appetite 1 2 1  0 3  Feeling bad or failure about yourself  1 3 3 2 2   Trouble concentrating 2 1 0 1 0  Moving slowly or fidgety/restless 0 0 0 2 0  Suicidal thoughts 0 0 0 0 0  PHQ-9 Score 9 11 9 14 15   Difficult doing work/chores Somewhat difficult Somewhat difficult Not difficult at all Somewhat difficult Somewhat difficult  Some recent data might be hidden     Assessment: 20 y.o. G0P0000 presenting for follow up  generalized anxiety  Plan: Problem List Items Addressed This Visit   None Visit Diagnoses     Generalized anxiety disorder    -  Primary   Relevant Medications   venlafaxine XR (EFFEXOR-XR) 75 MG 24 hr capsule       1) Generalized anxiety - good improvement in symptoms with switch to Effexor XR 75mg  from Wellbutrin XR  2) Thyroid and B12 screen has been obtained previously  3) Telephone time 5:00  4) Return in about 3 months (around 03/13/2021) for medication follow up (phone).    , MD, [facility], [facility] 12/11/2020, 11:58 AM

## 2020-12-14 ENCOUNTER — Other Ambulatory Visit: Payer: Self-pay

## 2020-12-14 ENCOUNTER — Ambulatory Visit (INDEPENDENT_AMBULATORY_CARE_PROVIDER_SITE_OTHER): Payer: 59 | Admitting: Physician Assistant

## 2020-12-14 ENCOUNTER — Encounter: Payer: Self-pay | Admitting: Physician Assistant

## 2020-12-14 VITALS — BP 114/80 | HR 101 | Temp 98.8°F | Ht 61.0 in | Wt 228.0 lb

## 2020-12-14 DIAGNOSIS — Z09 Encounter for follow-up examination after completed treatment for conditions other than malignant neoplasm: Secondary | ICD-10-CM

## 2020-12-14 DIAGNOSIS — K8020 Calculus of gallbladder without cholecystitis without obstruction: Secondary | ICD-10-CM

## 2020-12-14 NOTE — Progress Notes (Signed)
[facility] POST-OP OFFICE VISIT  12/14/2020  HPI: Tina [name] is a 20 y.o. female 20 days s/p robotic assisted laparoscopic cholecystectomy for symptomatic cholelithiasis with Dr Lady Gary.   She has been doing well She reports that she only needed the narcotic pain medications for a few day, no longer having any pain No fever, chills, nausea, emesis, or diarrhea She is eating and drinking normally No issues with incisions No other complaints.   Vital signs: BP 114/80   Pulse (!) 101   Temp 98.8 F (37.1 C)   Ht 5\' 1"  (1.549 m)   Wt 228 lb (103.4 kg)   SpO2 98%   BMI 43.08 kg/m    Physical Exam: Constitutional: Well appearing female, NAD Abdomen: Soft, non-tender, non-distended, no rebound/guarding Skin: Laparoscopic incisions are well healed, no erythema or drainage   Assessment/Plan: This is a 19 y.o. female 20 days s/p robotic assisted laparoscopic cholecystectomy for symptomatic cholelithiasis   - Pain control prn  - Reviewed wound care  - Reviewed lifting restrictions; 4 weeks total  - Reviewed pathology; CCC  - She can rtc on as needed basis   -- 35, PA-C [facility] 12/14/2020, 10:33 AM [phone] M-F: 7am - 4pm

## 2020-12-14 NOTE — Patient Instructions (Signed)

## 2021-01-23 ENCOUNTER — Encounter: Payer: Self-pay | Admitting: General Surgery

## 2021-03-13 ENCOUNTER — Ambulatory Visit: Payer: 59 | Admitting: Obstetrics and Gynecology

## 2021-03-14 ENCOUNTER — Ambulatory Visit (INDEPENDENT_AMBULATORY_CARE_PROVIDER_SITE_OTHER): Payer: 59 | Admitting: Obstetrics and Gynecology

## 2021-03-14 ENCOUNTER — Other Ambulatory Visit: Payer: Self-pay

## 2021-03-14 DIAGNOSIS — F419 Anxiety disorder, unspecified: Secondary | ICD-10-CM

## 2021-03-14 DIAGNOSIS — F32A Depression, unspecified: Secondary | ICD-10-CM

## 2021-03-14 MED ORDER — VENLAFAXINE HCL ER 75 MG PO CP24: 75.0000 mg | ORAL_CAPSULE | Freq: Every day | ORAL | 3 refills | Status: DC

## 2021-03-14 MED ORDER — LEVONORGEST-ETH EST & ETH EST 42-21-21-7 DAYS PO TABS: 1.0000 | ORAL_TABLET | Freq: Every day | ORAL | 3 refills | Status: DC

## 2021-03-14 NOTE — Progress Notes (Signed)
I connected with Tina [name] on 03/14/21 at 10:50 AM EST by telephone and verified that I am speaking with the correct person using two identifiers.   I discussed the limitations, risks, security and privacy concerns of performing an evaluation and management service by telephone and the availability of in person appointments. I also discussed with the patient that there may be a patient responsible charge related to this service. The patient expressed understanding and agreed to proceed.  The patient was at home I spoke with the patient from my workstation phone The names of people involved in this encounter were: Tina [name] , and Vena Austria   Obstetrics & Gynecology Office Visit   Chief Complaint:  Chief Complaint  Patient presents with   Follow-up    Medication/Gen Anxiety no issues    History of Present Illness: The patient is a 20 y.o. female presenting initial evaluation for symptoms of anxiety and depression.  The patient is currently taking Effexor XR 75mg  for the management of her symptoms.  She has not had any recent situational stressors.  Reports classes are going well, she has a good social support system at school.  Still some anxiety around driving.  She reports symptoms remain well controlled on current dose of Effexor XR.  She denies anhedonia, day time somnolence, insomnia, risk taking behavior, irritability, social anxiety, agorophobia, feelings of guilt, feelings of worthlessness, suicidal ideation, homicidal ideation, auditory hallucinations, and visual hallucinations. Symptoms have remained unchanged since last visit.     Review of Systems: Review of Systems  Constitutional: Negative.   Gastrointestinal:  Negative for nausea.  Neurological:  Negative for headaches.  Psychiatric/Behavioral: Negative.      Past Medical History:  Past Medical History:  Diagnosis Date   Anemia    Anxiety    Depression    GERD (gastroesophageal reflux disease)     Thyroid disease    Tonsil stone    Vocal cord nodule     Past Surgical History:  Past Surgical History:  Procedure Laterality Date   NO PAST SURGERIES      Gynecologic History: No LMP recorded. (Menstrual status: Oral contraceptives).  Obstetric History: G0P0000  Family History:  Family History  Problem Relation Age of Onset   Lung cancer Maternal Grandfather    Breast cancer Paternal Grandmother 31       Malignant neoplasms, Estrogen Receptor positive    Social History:  Social History   Socioeconomic History   Marital status: Single    Spouse name: Not on file   Number of children: Not on file   Years of education: Not on file   Highest education level: Not on file  Occupational History   Not on file  Tobacco Use   Smoking status: Never   Smokeless tobacco: Never  Vaping Use   Vaping Use: Never used  Substance and Sexual Activity   Alcohol use: No   Drug use: No   Sexual activity: Not Currently    Birth control/protection: Pill  Other Topics Concern   Not on file  Social History Narrative   Not on file   Social Determinants of Health   Financial Resource Strain: Not on file  Food Insecurity: Not on file  Transportation Needs: Not on file  Physical Activity: Not on file  Stress: Not on file  Social Connections: Not on file  Intimate Partner Violence: Not on file    Allergies:  Allergies  Allergen Reactions   Latex Itching and  Swelling    Medications: Prior to Admission medications   Medication Sig Start Date End Date Taking? Authorizing Provider  venlafaxine XR (EFFEXOR-XR) 75 MG 24 hr capsule Take 1 capsule (75 mg total) by mouth daily. 12/11/20  Yes Vena Austria, MD  ibuprofen (ADVIL) 800 MG tablet Take 1 tablet (800 mg total) by mouth every 8 (eight) hours as needed. Patient not taking: No sig reported 11/24/20   Duanne Guess, MD  Levonorgest-Eth Est & Eth Est Nilda Calamity) 42-21-21-7 DAYS TABS Take 1 tablet by mouth daily. 07/13/20  11/14/20  Vena Austria, MD  oxyCODONE (OXY IR/ROXICODONE) 5 MG immediate release tablet Take 1 tablet (5 mg total) by mouth every 6 (six) hours as needed for severe pain. Patient not taking: No sig reported 11/24/20   Duanne Guess, MD    Physical Exam Vitals: There were no vitals filed for this visit. No LMP recorded. (Menstrual status: Oral contraceptives).  No physical exam as this was a remote telephone visit to promote social distancing during the current COVID-19 Pandemic   GAD 7 : Generalized Anxiety Score 03/14/2021 12/11/2020 11/21/2020 09/20/2020  Nervous, Anxious, on Edge 1 2 3 3   Control/stop worrying 1 1 3 2   Worry too much - different things 1 1 3 3   Trouble relaxing 0 1 3 1   Restless 1 0 1 0  Easily annoyed or irritable 2 1 3 3   Afraid - awful might happen 1 1 1  0  Total GAD 7 Score 7 7 17 12   Anxiety Difficulty Not difficult at all Somewhat difficult Very difficult Somewhat difficult      Assessment: 20 y.o. G0P0000 follow up anxiety and depression  Plan: Problem List Items Addressed This Visit   None Visit Diagnoses     Anxiety and depression    -  Primary   Relevant Medications   venlafaxine XR (EFFEXOR-XR) 75 MG 24 hr capsule       1) Anxiety / Depression - in remission continues to report good symptom control on current regimen of Effexor XR 75mg .  GAD-7 stable - continue Effexor XR 75mg   2) Thyroid and B12 screen has not been obtained previously  3) Telephone time 5:55min  4) Return in about 4 months (around 07/12/2021) for Annual.    , MD, OB/GYN, [facility] 03/14/2021, 10:30 AM

## 2022-01-15 LAB — HM HEPATITIS C SCREENING LAB: HM Hepatitis Screen: NEGATIVE

## 2022-01-15 LAB — HM PAP SMEAR: HM Pap smear: ABNORMAL

## 2022-08-15 ENCOUNTER — Encounter: Payer: Self-pay | Admitting: Internal Medicine

## 2022-08-15 ENCOUNTER — Ambulatory Visit: Payer: 59 | Admitting: Internal Medicine

## 2022-08-15 VITALS — BP 124/70 | HR 119 | Temp 98.1°F | Resp 18 | Ht 61.0 in | Wt 260.4 lb

## 2022-08-15 DIAGNOSIS — E038 Other specified hypothyroidism: Secondary | ICD-10-CM

## 2022-08-15 DIAGNOSIS — Z23 Encounter for immunization: Secondary | ICD-10-CM | POA: Diagnosis not present

## 2022-08-15 DIAGNOSIS — Z3041 Encounter for surveillance of contraceptive pills: Secondary | ICD-10-CM | POA: Diagnosis not present

## 2022-08-15 DIAGNOSIS — F339 Major depressive disorder, recurrent, unspecified: Secondary | ICD-10-CM

## 2022-08-15 DIAGNOSIS — D509 Iron deficiency anemia, unspecified: Secondary | ICD-10-CM

## 2022-08-15 MED ORDER — IRON SLOW RELEASE 143 (45 FE) MG PO TBCR: 1.0000 | EXTENDED_RELEASE_TABLET | Freq: Every day | ORAL | 1 refills | Status: DC

## 2022-08-15 MED ORDER — VENLAFAXINE HCL ER 150 MG PO CP24: 150.0000 mg | ORAL_CAPSULE | Freq: Every day | ORAL | 1 refills | Status: DC

## 2022-08-15 NOTE — Progress Notes (Signed)
New Patient Office Visit  Subjective    Patient ID: Tina [name], female    DOB: 23-Mar-2001  Age: 22 y.o. MRN: [id number]  CC:  Chief Complaint  Patient presents with   Establish Care   Labs Only    Hx of thyroid   Obesity    HPI Tina [name] presents to establish care.  MDD: -Mood status: stable -Current treatment: Effexor XR 150 mg   -Satisfied with current treatment?: no -Duration of current treatment : years - has been on this dose for 6 months -Side effects: no Medication compliance: excellent compliance Psychotherapy/counseling: yes current but just started Previous psychiatric medications: buspar, lexapro, and wellbutrin. Lexapro made her feel tired and Wellbutrin was ok but the Effexor works better      08/15/2022   11:01 AM 12/11/2020   11:32 AM 11/21/2020    8:32 AM 09/20/2020   11:21 AM 09/05/2020    5:00 PM  Depression screen PHQ 2/9  Decreased Interest 3 1 2 2 3   Down, Depressed, Hopeless 2 1 2 2 1   PHQ - 2 Score 5 2 4 4 4   Altered sleeping 3 1 0 0 3  Tired, decreased energy 3 2 1 1 2   Change in appetite 3 1 2 1  0  Feeling bad or failure about yourself  2 1 3 3 2   Trouble concentrating 2 2 1  0 1  Moving slowly or fidgety/restless 0 0 0 0 2  Suicidal thoughts 0 0 0 0 0  PHQ-9 Score 18 9 11 9 14   Difficult doing work/chores Somewhat difficult Somewhat difficult Somewhat difficult Not difficult at all Somewhat difficult   Contraception: -Currently on OCP's, has been on this for about 6 years. Has a period every 3 months. No side effects, doing well -Originally used it for menorrhagia -Is currently sexually active with one female partner   Subclinical Hypothyroidism:  -Last TSH 9/23 5/24, free T4 0.88 -Does admit to difficulty losing weight, constipation, fatigue and feeling cold  Anemia: -Last CBC 9/23 hgb 11.0, MCV 75 -Had been on iron supplements in the past but not currently  Health Maintenance: -Blood work UTD -Pap 9/23 ASCUS, following  up with Gynecology in September for repeat -HPV vaccine series due  Outpatient Encounter Medications as of 08/15/2022  Medication Sig   Levonorgest-Eth Est & Eth Est 42-21-21-7 DAYS TABS Take 1 tablet by mouth daily.   venlafaxine XR (EFFEXOR-XR) 150 MG 24 hr capsule Take 150 mg by mouth daily.   cyanocobalamin (VITAMIN B12) 1000 MCG tablet Take 1,000 mcg by mouth daily. (Patient not taking: Reported on 08/15/2022)   FEROSUL 325 (65 Fe) MG tablet Take 325 mg by mouth every morning. (Patient not taking: Reported on 08/15/2022)   [DISCONTINUED] Levonorgest-Eth Est & Eth Est (QUARTETTE) 42-21-21-7 DAYS TABS Take 1 tablet by mouth daily.   [DISCONTINUED] venlafaxine XR (EFFEXOR-XR) 75 MG 24 hr capsule Take 1 capsule (75 mg total) by mouth daily.   No facility-administered encounter medications on file as of 08/15/2022.    Past Medical History:  Diagnosis Date   Anemia    Anxiety    Depression    GERD (gastroesophageal reflux disease)    Thyroid disease    Tonsil stone    Vocal cord nodule     Past Surgical History:  Procedure Laterality Date   CHOLECYSTECTOMY  November 24 2020   NO PAST SURGERIES      Family History  Problem Relation Age of Onset  Lung cancer Maternal Grandfather    Cancer Maternal Grandfather    Breast cancer Paternal Grandmother [id number]       Malignant neoplasms, Estrogen Receptor positive   Asthma Paternal Grandmother    Cancer Paternal Grandmother    Heart disease Paternal Grandmother    Miscarriages / Stillbirths Paternal Grandmother    Obesity Paternal Grandmother    Hypertension Father    Obesity Father    Obesity Paternal Grandfather    Asthma Brother    Obesity Brother    Depression Brother    Obesity Brother    Obesity Paternal Uncle     Social History   Socioeconomic History   Marital status: Single    Spouse name: Not on file   Number of children: Not on file   Years of education: Not on file   Highest education level: Not on file   Occupational History   Not on file  Tobacco Use   Smoking status: Never   Smokeless tobacco: Never  Vaping Use   Vaping Use: Never used  Substance and Sexual Activity   Alcohol use: No   Drug use: Yes    Frequency: 3.0 times per week    Comment: Delta 8   Sexual activity: Yes    Birth control/protection: Condom, Pill  Other Topics Concern   Not on file  Social History Narrative   Not on file   Social Determinants of Health   Financial Resource Strain: Not on file  Food Insecurity: Not on file  Transportation Needs: Not on file  Physical Activity: Inactive (04/23/2017)   Exercise Vital Sign    Days of Exercise per Week: 0 days    Minutes of Exercise per Session: 0 min  Stress: Stress Concern Present (04/23/2017)   [employer] - Occupational Stress Questionnaire    Feeling of Stress : To some extent  Social Connections: Moderately Integrated (04/23/2017)   Social Connection and Isolation Panel [NHANES]    Frequency of Communication with Friends and Family: More than three times a week    Frequency of Social Gatherings with Friends and Family: More than three times a week    Attends Religious Services: More than 4 times per year    Active Member of [employer] or Organizations: Yes    Attends BankerClub or Organization Meetings: More than 4 times per year    Marital Status: Never married  Intimate Partner Violence: Not At Risk (04/23/2017)   Humiliation, Afraid, Rape, and Kick questionnaire    Fear of Current or Ex-Partner: No    Emotionally Abused: No    Physically Abused: No    Sexually Abused: No    Review of Systems  Constitutional:  Positive for malaise/fatigue. Negative for chills and fever.  Respiratory:  Negative for shortness of breath.   Cardiovascular:  Negative for chest pain.        Objective    BP 124/70   Pulse (!) 119   Temp 98.1 F (36.7 C)   Resp 18   Ht 5\' 1"  (1.549 m)   Wt 260 lb 6.4 oz (118.1 kg)   LMP 08/14/2022    SpO2 97%   BMI 49.20 kg/m   Physical Exam Constitutional:      Appearance: Normal appearance.  HENT:     Head: Normocephalic and atraumatic.     Mouth/Throat:     Mouth: Mucous membranes are moist.     Pharynx: Oropharynx is clear.  Eyes:     Extraocular  Movements: Extraocular movements intact.     Conjunctiva/sclera: Conjunctivae normal.     Pupils: Pupils are equal, round, and reactive to light.  Neck:     Comments: No thyromegaly  Cardiovascular:     Rate and Rhythm: Normal rate and regular rhythm.  Pulmonary:     Effort: Pulmonary effort is normal.     Breath sounds: Normal breath sounds.  Musculoskeletal:     Cervical back: No tenderness.     Right lower leg: No edema.     Left lower leg: No edema.  Lymphadenopathy:     Cervical: No cervical adenopathy.  Skin:    General: Skin is warm and dry.  Neurological:     General: No focal deficit present.     Mental Status: She is alert. Mental status is at baseline.  Psychiatric:        Mood and Affect: Mood normal.        Behavior: Behavior normal.         Assessment & Plan:   1. Episode of recurrent major depressive disorder, unspecified depression episode severity: Stable, continue Effexor 150 mg daily, refilled.   - venlafaxine XR (EFFEXOR-XR) 150 MG 24 hr capsule; Take 1 capsule (150 mg total) by mouth daily.  Dispense: 90 capsule; Refill: 1  2. Encounter for surveillance of contraceptive pills: Stable doing well on OPC's. Discussed potential side effects with the patient.   3. Subclinical hypothyroidism: Does have some symptoms but TSH and free T4 normal. Will recheck at follow up.   4. Iron deficiency anemia, unspecified iron deficiency anemia type: Hemoglobin 11 in September with low MCV, restart iron supplements and recheck in the fall.  - Ferrous Sulfate (IRON SLOW RELEASE) 143 (45 Fe) MG TBCR; Take 1 each by mouth daily.  Dispense: 90 tablet; Refill: 1  5. Need for HPV vaccination: First HPV vaccine  given today, return in 6 weeks for the second and will give the third at follow up.  - HPV 9-valent vaccine,Recombinat   Return in about 6 months (around 02/14/2023) for 6 months for medical follow up, 6 weeks for second HPV vaccine for nurse's visit .   Margarita Mail, DO

## 2022-08-15 NOTE — Patient Instructions (Signed)
It was great seeing you today!  Plan discussed at today's visit: -Medications refilled -First HPV vaccine given today -Plan for fasting labs at follow up in 6 months -Nurse's visit in 6 weeks for second HPV vaccine   Follow up in: 6 months   Take care and let us know if you have any questions or concerns prior to your next visit.  Dr. Caralee Ates

## 2022-10-02 ENCOUNTER — Ambulatory Visit (INDEPENDENT_AMBULATORY_CARE_PROVIDER_SITE_OTHER): Payer: 59

## 2022-10-02 ENCOUNTER — Other Ambulatory Visit: Payer: Self-pay

## 2022-10-02 DIAGNOSIS — Z23 Encounter for immunization: Secondary | ICD-10-CM

## 2022-10-02 DIAGNOSIS — Z111 Encounter for screening for respiratory tuberculosis: Secondary | ICD-10-CM

## 2022-10-02 DIAGNOSIS — Z0184 Encounter for antibody response examination: Secondary | ICD-10-CM

## 2022-10-02 NOTE — Progress Notes (Deleted)
Patient presented in expressed good health for 2nd HPV vaccine within specified time frame as well as blood draw for TB screening for school purposes. Patient tolerated injection well and had no adverse reaction reported or noted at time of clinic departure.

## 2022-10-02 NOTE — Progress Notes (Signed)
Patient presented in expressed good health for 2nd HPV vaccine within specified time frame as well as blood draw for TB, Hep B and Varicella titer screening for school purposes.She also scheduled a CPE for school paperwork completion (placed up front in folder.) Patient tolerated injection well and had no adverse reaction reported or noted at time of clinic departure.

## 2022-10-04 LAB — QUANTIFERON-TB GOLD PLUS
Mitogen-NIL: 8.06 IU/mL
NIL: 0.05 IU/mL
QuantiFERON-TB Gold Plus: NEGATIVE
TB1-NIL: 0.01 IU/mL
TB2-NIL: 0.01 IU/mL

## 2022-10-04 LAB — VARICELLA ZOSTER ANTIBODY, IGG: Varicella IgG: 459.3 index

## 2022-10-04 LAB — HEPATITIS B CORE ANTIBODY, IGM: Hep B C IgM: NONREACTIVE

## 2022-10-06 NOTE — Progress Notes (Unsigned)
Name: Tina [name]   MRN: [id number]    DOB: 2001/03/19   Date:10/06/2022       Progress Note  Subjective  Chief Complaint  No chief complaint on file.   HPI  Patient presents for annual CPE.  Diet: *** Exercise: ***  Last Eye Exam: *** Last Dental Exam: ***   Depression: Phq 9 is  {Desc; negative/positive:13464}    08/15/2022   11:01 AM 12/11/2020   11:32 AM 11/21/2020    8:32 AM 09/20/2020   11:21 AM 09/05/2020    5:00 PM  Depression screen PHQ 2/9  Decreased Interest 3 1 2 2 3   Down, Depressed, Hopeless 2 1 2 2 1   PHQ - 2 Score 5 2 4 4 4   Altered sleeping 3 1 0 0 3  Tired, decreased energy 3 2 1 1 2   Change in appetite 3 1 2 1  0  Feeling bad or failure about yourself  2 1 3 3 2   Trouble concentrating 2 2 1  0 1  Moving slowly or fidgety/restless 0 0 0 0 2  Suicidal thoughts 0 0 0 0 0  PHQ-9 Score 18 9 11 9 14   Difficult doing work/chores Somewhat difficult Somewhat difficult Somewhat difficult Not difficult at all Somewhat difficult   Hypertension: BP Readings from Last 3 Encounters:  08/15/22 124/70  12/14/20 114/80  11/24/20 99/66   Obesity: Wt Readings from Last 3 Encounters:  08/15/22 260 lb 6.4 oz (118.1 kg)  12/14/20 228 lb (103.4 kg)  11/24/20 235 lb (106.6 kg)   BMI Readings from Last 3 Encounters:  08/15/22 49.20 kg/m  12/14/20 43.08 kg/m  11/24/20 44.40 kg/m     Vaccines:   HPV: second HPV 5/24 - third due in October  Tdap: 2019 Shingrix: n/a Pneumonia: n/a Flu: uncertain COVID-19: 2021   Hep C Screening:  STD testing and prevention (HIV/chl/gon/syphilis):  Intimate partner violence: {Desc; negative/positive:13464} screen  Sexual History : Menstrual History/LMP/Abnormal Bleeding:  Discussed importance of follow up if any post-menopausal bleeding: {Response; yes/no/na:63}  Incontinence Symptoms: {Desc; negative/positive:13464} for symptoms   Breast cancer:  - Last Mammogram: *** - BRCA gene screening:   Osteoporosis Prevention  : Discussed high calcium and vitamin D supplementation, weight bearing exercises Bone density :{Response; yes/no/na:63}   Cervical cancer screening:   Skin cancer: Discussed monitoring for atypical lesions  Colorectal cancer: ***   Lung cancer:  Low Dose CT Chest recommended if Age 9-80 years, 20 pack-year currently smoking OR have quit w/in 15years. Patient {DOES NOT does:27190::"does not"} qualify for screen   ECG: ***  Advanced Care Planning: A voluntary discussion about advance care planning including the explanation and discussion of advance directives.  Discussed health care proxy and Living will, and the patient was able to identify a health care proxy as ***.  Patient {DOES_DOES WUJ:81191} have a living will and power of attorney of health care   Lipids: No results found for: "CHOL" No results found for: "HDL" No results found for: "LDLCALC" No results found for: "TRIG" No results found for: "CHOLHDL" No results found for: "LDLDIRECT"  Glucose: No results found for: "GLUCOSE", "GLUCAP"  Patient Active Problem List   Diagnosis Date Noted   Hypothyroidism 09/12/2020    Past Surgical History:  Procedure Laterality Date   CHOLECYSTECTOMY  November 24 2020   NO PAST SURGERIES      Family History  Problem Relation Age of Onset   Lung cancer Maternal Grandfather    Cancer Maternal Grandfather  Breast cancer Paternal Grandmother 70       Malignant neoplasms, Estrogen Receptor positive   Asthma Paternal Grandmother    Cancer Paternal Grandmother    Heart disease Paternal Grandmother    Miscarriages / Stillbirths Paternal Grandmother    Obesity Paternal Grandmother    Hypertension Father    Obesity Father    Obesity Paternal Grandfather    Asthma Brother    Obesity Brother    Depression Brother    Obesity Brother    Obesity Paternal Uncle     Social History   Socioeconomic History   Marital status: Single    Spouse name: Not on file   Number of children: Not  on file   Years of education: Not on file   Highest education level: Not on file  Occupational History   Not on file  Tobacco Use   Smoking status: Never   Smokeless tobacco: Never  Vaping Use   Vaping Use: Never used  Substance and Sexual Activity   Alcohol use: No   Drug use: Yes    Frequency: 3.0 times per week    Comment: Delta 8   Sexual activity: Yes    Birth control/protection: Condom, Pill  Other Topics Concern   Not on file  Social History Narrative   Not on file   Social Determinants of Health   Financial Resource Strain: Not on file  Food Insecurity: Not on file  Transportation Needs: Not on file  Physical Activity: Inactive (04/23/2017)   Exercise Vital Sign    Days of Exercise per Week: 0 days    Minutes of Exercise per Session: 0 min  Stress: Stress Concern Present (04/23/2017)   [employer] of Occupational Health - Occupational Stress Questionnaire    Feeling of Stress : To some extent  Social Connections: Moderately Integrated (04/23/2017)   Social Connection and Isolation Panel [NHANES]    Frequency of Communication with Friends and Family: More than three times a week    Frequency of Social Gatherings with Friends and Family: More than three times a week    Attends Religious Services: More than 4 times per year    Active Member of [employer] or Organizations: Yes    Attends Banker Meetings: More than 4 times per year    Marital Status: Never married  Intimate Partner Violence: Not At Risk (04/23/2017)   Humiliation, Afraid, Rape, and Kick questionnaire    Fear of Current or Ex-Partner: No    Emotionally Abused: No    Physically Abused: No    Sexually Abused: No     Current Outpatient Medications:    cyanocobalamin (VITAMIN B12) 1000 MCG tablet, Take 1,000 mcg by mouth daily. (Patient not taking: Reported on 08/15/2022), Disp: , Rfl:    Ferrous Sulfate (IRON SLOW RELEASE) 143 (45 Fe) MG TBCR, Take 1 each by mouth daily., Disp: 90  tablet, Rfl: 1   Levonorgest-Eth Est & Eth Est 42-21-21-7 DAYS TABS, Take 1 tablet by mouth daily., Disp: , Rfl:    venlafaxine XR (EFFEXOR-XR) 150 MG 24 hr capsule, Take 1 capsule (150 mg total) by mouth daily., Disp: 90 capsule, Rfl: 1  Allergies  Allergen Reactions   Latex Itching and Swelling     ROS  ***  Objective  There were no vitals filed for this visit.  There is no height or weight on file to calculate BMI.  Physical Exam ***  Recent Results (from the past 2160 hour(s))  Varicella zoster antibody,  IgG     Status: None   Collection Time: 10/02/22  4:05 PM  Result Value Ref Range   Varicella IgG 459.30 index    Comment:        Index               Interpretation      ---------         ----------------------     <135.00            Negative - Antibody not detected     135.00 - 164.99    Equivocal     > or = 165.00      Positive - Antibody detected .     A positive result indicates that the patient     has antibody to VZV but does not differentiate     between an active or past infection.      The clinical diagnosis must be interpreted in      conjunction with the clinical signs and symptoms of      the patient. This assay reliably measures immunity     due to previous infection but may not be      sensitive enough to detect antibodies induced by     vaccination. Thus, a negative result in a vaccinated     individual does not necessarily indicate     susceptibility to VZV infection. A more sensitive     test for vaccination-induced immunity is Varicella     Zoster Virus Antibody Immunity Screen, ACIF.   Hepatitis B Core Antibody, IgM     Status: None   Collection Time: 10/02/22  4:05 PM  Result Value Ref Range   Hep B C IgM NON-REACTIVE NON-REACTIVE    Comment: . For additional information, please refer to  http://education.[facility].com/faq/FAQ202  (This link is being provided for informational/ educational purposes only.) .   QuantiFERON-TB  Gold Plus     Status: None   Collection Time: 10/02/22  4:05 PM  Result Value Ref Range   QuantiFERON-TB Gold Plus NEGATIVE NEGATIVE    Comment: Negative test result. M. tuberculosis complex  infection unlikely.    NIL 0.05 IU/mL   Mitogen-NIL 8.06 IU/mL   TB1-NIL 0.01 IU/mL   TB2-NIL 0.01 IU/mL    Comment: . The Nil tube value reflects the background interferon gamma immune response of the patient's blood sample. This value has been subtracted from the patient's displayed TB and Mitogen results. . Lower than expected results with the Mitogen tube prevent false-negative Quantiferon readings by detecting a patient with a potential immune suppressive condition and/or suboptimal pre-analytical specimen handling. . The TB1 Antigen tube is coated with the M. tuberculosis-specific antigens designed to elicit responses from TB antigen primed CD4+ helper T-lymphocytes. . The TB2 Antigen tube is coated with the M. tuberculosis-specific antigens designed to elicit responses from TB antigen primed CD4+ helper and CD8+ cytotoxic T-lymphocytes. . For additional information, please refer to https://education.[facility].com/faq/FAQ204 (This link is being provided for informational/ educational purposes only.) .      Fall Risk:    08/15/2022   11:01 AM 09/12/2020   10:18 AM  Fall Risk   Falls in the past year? 0 0  Number falls in past yr: 0   Injury with Fall? 0    ***  Functional Status Survey:   ***  Assessment & Plan  There are no diagnoses linked to this encounter.  -USPSTF grade A and B recommendations reviewed with patient; age-appropriate recommendations,  preventive care, screening tests, etc discussed and encouraged; healthy living encouraged; see AVS for patient education given to patient -Discussed importance of 150 minutes of physical activity weekly, eat two servings of fish weekly, eat one serving of tree nuts ( cashews, pistachios, pecans, almonds.Marland Kitchen)  every other day, eat 6 servings of fruit/vegetables daily and drink plenty of water and avoid sweet beverages.   -Reviewed Health Maintenance: {yes ZO:109604}

## 2022-10-07 ENCOUNTER — Encounter: Payer: Self-pay | Admitting: Internal Medicine

## 2022-10-07 ENCOUNTER — Ambulatory Visit (INDEPENDENT_AMBULATORY_CARE_PROVIDER_SITE_OTHER): Payer: 59 | Admitting: Internal Medicine

## 2022-10-07 VITALS — BP 124/64 | HR 104 | Temp 98.3°F | Resp 16 | Ht 61.0 in | Wt 257.2 lb

## 2022-10-07 DIAGNOSIS — Z23 Encounter for immunization: Secondary | ICD-10-CM | POA: Diagnosis not present

## 2022-10-07 DIAGNOSIS — Z114 Encounter for screening for human immunodeficiency virus [HIV]: Secondary | ICD-10-CM

## 2022-10-07 DIAGNOSIS — Z0289 Encounter for other administrative examinations: Secondary | ICD-10-CM

## 2022-10-07 DIAGNOSIS — D509 Iron deficiency anemia, unspecified: Secondary | ICD-10-CM

## 2022-10-07 NOTE — Progress Notes (Signed)
Established Patient Office Visit  Subjective    Patient ID: Tina [name], female    DOB: 2000/06/08  Age: 22 y.o. MRN: [id number]  CC:  Chief Complaint  Patient presents with   Follow-up    HPI Tina [name] presents to follow up on chronic medical conditions and for form completion for school.   MDD: -Mood status: stable -Current treatment: Effexor XR 150 mg   -Satisfied with current treatment?: no -Duration of current treatment : years - has been on this dose for 6 months -Side effects: no Medication compliance: excellent compliance Psychotherapy/counseling: yes current but just started Previous psychiatric medications: buspar, lexapro, and wellbutrin. Lexapro made her feel tired and Wellbutrin was ok but the Effexor works better      10/07/2022    9:47 AM 08/15/2022   11:01 AM 12/11/2020   11:32 AM 11/21/2020    8:32 AM 09/20/2020   11:21 AM  Depression screen PHQ 2/9  Decreased Interest 0 3 1 2 2   Down, Depressed, Hopeless 0 2 1 2 2   PHQ - 2 Score 0 5 2 4 4   Altered sleeping 0 3 1 0 0  Tired, decreased energy 0 3 2 1 1   Change in appetite 0 3 1 2 1   Feeling bad or failure about yourself  0 2 1 3 3   Trouble concentrating 0 2 2 1  0  Moving slowly or fidgety/restless 0 0 0 0 0  Suicidal thoughts 0 0 0 0 0  PHQ-9 Score 0 18 9 11 9   Difficult doing work/chores Not difficult at all Somewhat difficult Somewhat difficult Somewhat difficult Not difficult at all   Contraception: -Currently on OCP's, has been on this for about 6 years. Has a period every 3 months. No side effects, doing well -Originally used it for menorrhagia -Is currently sexually active with one female partner   Subclinical Hypothyroidism:  -Last TSH 9/23 5/24, free T4 0.88 -Does admit to difficulty losing weight, constipation, fatigue and feeling cold  Anemia: -Last CBC 9/23 hgb 11.0, MCV 75 -Is currently on slow released iron supplements   Health Maintenance: -Blood work UTD -Pap 9/23 ASCUS,  following up with Gynecology in September for repeat -HPV vaccine series: has had 2 vaccines, third will be due in October  -Started Osteopathic school soon - hepatitis B antibody negative, due for repeat vaccines.   Outpatient Encounter Medications as of 10/07/2022  Medication Sig   Ferrous Sulfate (IRON SLOW RELEASE) 143 (45 Fe) MG TBCR Take 1 each by mouth daily.   Levonorgest-Eth Est & Eth Est 42-21-21-7 DAYS TABS Take 1 tablet by mouth daily.   venlafaxine XR (EFFEXOR-XR) 150 MG 24 hr capsule Take 1 capsule (150 mg total) by mouth daily.   [DISCONTINUED] cyanocobalamin (VITAMIN B12) 1000 MCG tablet Take 1,000 mcg by mouth daily. (Patient not taking: Reported on 08/15/2022)   No facility-administered encounter medications on file as of 10/07/2022.    Past Medical History:  Diagnosis Date   Anemia    Anxiety    Depression    GERD (gastroesophageal reflux disease)    Thyroid disease    Tonsil stone    Vocal cord nodule     Past Surgical History:  Procedure Laterality Date   CHOLECYSTECTOMY  November 24 2020   NO PAST SURGERIES      Family History  Problem Relation Age of Onset   Lung cancer Maternal Grandfather    Cancer Maternal Grandfather    Breast cancer Paternal Grandmother  72       Malignant neoplasms, Estrogen Receptor positive   Asthma Paternal Grandmother    Cancer Paternal Grandmother    Heart disease Paternal Grandmother    Miscarriages / Stillbirths Paternal Grandmother    Obesity Paternal Grandmother    Hypertension Father    Obesity Father    Obesity Paternal Grandfather    Asthma Brother    Obesity Brother    Depression Brother    Obesity Brother    Obesity Paternal Uncle     Social History   Socioeconomic History   Marital status: Single    Spouse name: Not on file   Number of children: Not on file   Years of education: Not on file   Highest education level: Not on file  Occupational History   Not on file  Tobacco Use   Smoking status: Never    Smokeless tobacco: Never  Vaping Use   Vaping Use: Never used  Substance and Sexual Activity   Alcohol use: No   Drug use: Yes    Frequency: 3.0 times per week    Comment: Delta 8   Sexual activity: Yes    Birth control/protection: Condom, Pill  Other Topics Concern   Not on file  Social History Narrative   Not on file   Social Determinants of Health   Financial Resource Strain: Not on file  Food Insecurity: Not on file  Transportation Needs: Not on file  Physical Activity: Inactive (04/23/2017)   Exercise Vital Sign    Days of Exercise per Week: 0 days    Minutes of Exercise per Session: 0 min  Stress: Stress Concern Present (04/23/2017)   [employer] of Occupational Health - Occupational Stress Questionnaire    Feeling of Stress : To some extent  Social Connections: Moderately Integrated (04/23/2017)   Social Connection and Isolation Panel [NHANES]    Frequency of Communication with Friends and Family: More than three times a week    Frequency of Social Gatherings with Friends and Family: More than three times a week    Attends Religious Services: More than 4 times per year    Active Member of [employer] or Organizations: Yes    Attends Banker Meetings: More than 4 times per year    Marital Status: Never married  Intimate Partner Violence: Not At Risk (04/23/2017)   Humiliation, Afraid, Rape, and Kick questionnaire    Fear of Current or Ex-Partner: No    Emotionally Abused: No    Physically Abused: No    Sexually Abused: No    Review of Systems  Constitutional:  Negative for chills and fever.  Respiratory:  Negative for shortness of breath.   Cardiovascular:  Negative for chest pain.        Objective    BP 124/64   Pulse (!) 104   Temp 98.3 F (36.8 C)   Resp 16   Ht 5\' 1"  (1.549 m)   Wt 257 lb 3.2 oz (116.7 kg)   SpO2 98%   BMI 48.60 kg/m   Physical Exam Constitutional:      Appearance: Normal appearance.  HENT:     Head:  Normocephalic and atraumatic.     Right Ear: Tympanic membrane, ear canal and external ear normal.     Left Ear: Tympanic membrane, ear canal and external ear normal.     Nose: Nose normal.     Mouth/Throat:     Mouth: Mucous membranes are moist.  Pharynx: Oropharynx is clear.  Eyes:     Extraocular Movements: Extraocular movements intact.     Conjunctiva/sclera: Conjunctivae normal.     Pupils: Pupils are equal, round, and reactive to light.  Cardiovascular:     Rate and Rhythm: Normal rate and regular rhythm.  Pulmonary:     Effort: Pulmonary effort is normal.     Breath sounds: Normal breath sounds.  Abdominal:     General: There is no distension.     Palpations: Abdomen is soft.     Tenderness: There is no abdominal tenderness.  Musculoskeletal:     Right lower leg: No edema.     Left lower leg: No edema.  Skin:    General: Skin is warm and dry.  Neurological:     General: No focal deficit present.     Mental Status: She is alert. Mental status is at baseline.  Psychiatric:        Mood and Affect: Mood normal.        Behavior: Behavior normal.         Assessment & Plan:   1. Encounter for completion of form with patient: Physical forms for school filled out, waiting for labs below.   2. Iron deficiency anemia, unspecified iron deficiency anemia type: Recheck CBC and ferritin, patient has been on iron supplements for a few weeks now.   - CBC w/Diff/Platelet - Fe+TIBC+Fer  3. Encounter for screening for HIV: Screening due.   - HIV antibody (with reflex)  4. Need for hepatitis B vaccination: First vaccine today, return in 1 month for second.   - Heplisav-B (HepB-CPG) Vaccine   Return in about 4 weeks (around 11/04/2022) for nurse's visit for second hepatitis B vaccine .   Margarita Mail, DO

## 2022-10-08 LAB — CBC WITH DIFFERENTIAL/PLATELET
Absolute Monocytes: 479 cells/uL (ref 200–950)
Basophils Absolute: 53 cells/uL (ref 0–200)
Basophils Relative: 0.7 %
Eosinophils Absolute: 129 cells/uL (ref 15–500)
Eosinophils Relative: 1.7 %
HCT: 39.8 % (ref 35.0–45.0)
Hemoglobin: 13.3 g/dL (ref 11.7–15.5)
Lymphs Abs: 2478 cells/uL (ref 850–3900)
MCH: 28.6 pg (ref 27.0–33.0)
MCHC: 33.4 g/dL (ref 32.0–36.0)
MCV: 85.6 fL (ref 80.0–100.0)
MPV: 11 fL (ref 7.5–12.5)
Monocytes Relative: 6.3 %
Neutro Abs: 4461 cells/uL (ref 1500–7800)
Neutrophils Relative %: 58.7 %
Platelets: 353 10*3/uL (ref 140–400)
RBC: 4.65 10*6/uL (ref 3.80–5.10)
RDW: 13.1 % (ref 11.0–15.0)
Total Lymphocyte: 32.6 %
WBC: 7.6 10*3/uL (ref 3.8–10.8)

## 2022-10-08 LAB — IRON,TIBC AND FERRITIN PANEL
%SAT: 12 % (calc) — ABNORMAL LOW (ref 16–45)
Ferritin: 23 ng/mL (ref 16–154)
Iron: 55 ug/dL (ref 40–190)
TIBC: 468 mcg/dL (calc) — ABNORMAL HIGH (ref 250–450)

## 2022-10-08 LAB — HIV ANTIBODY (ROUTINE TESTING W REFLEX): HIV 1&2 Ab, 4th Generation: NONREACTIVE

## 2022-11-08 ENCOUNTER — Ambulatory Visit (INDEPENDENT_AMBULATORY_CARE_PROVIDER_SITE_OTHER): Payer: 59

## 2022-11-08 DIAGNOSIS — Z23 Encounter for immunization: Secondary | ICD-10-CM

## 2023-02-13 NOTE — Progress Notes (Signed)
Established Patient Office Visit  Subjective    Patient ID: Tina [name], female    DOB: Aug 28, 2000  Age: 22 y.o. MRN: [id number]  CC:  Chief Complaint  Patient presents with   Follow-up    HPI Tina [name] presents to follow up on chronic medical conditions.  MDD: -Mood status: stable -Current treatment: Effexor XR 150 mg   -Satisfied with current treatment?: yes -Duration of current treatment : years  -Side effects: no Medication compliance: excellent compliance Psychotherapy/counseling: no not anymore  Previous psychiatric medications: buspar, lexapro, and wellbutrin. Lexapro made her feel tired and Wellbutrin was ok but the Effexor works better      02/14/2023    9:25 AM 10/07/2022    9:47 AM 08/15/2022   11:01 AM 12/11/2020   11:32 AM 11/21/2020    8:32 AM  Depression screen PHQ 2/9  Decreased Interest 1 0 3 1 2   Down, Depressed, Hopeless 1 0 2 1 2   PHQ - 2 Score 2 0 5 2 4   Altered sleeping 2 0 3 1 0  Tired, decreased energy 3 0 3 2 1   Change in appetite 3 0 3 1 2   Feeling bad or failure about yourself  2 0 2 1 3   Trouble concentrating 2 0 2 2 1   Moving slowly or fidgety/restless 0 0 0 0 0  Suicidal thoughts 0 0 0 0 0  PHQ-9 Score 14 0 18 9 11   Difficult doing work/chores Somewhat difficult Not difficult at all Somewhat difficult Somewhat difficult Somewhat difficult   Contraception: -Currently on OCP's, has been on this for about 6 years. Has a period every 3 months. 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   Outpatient Encounter Medications as of 02/14/2023  Medication Sig   Ferrous Sulfate (IRON SLOW RELEASE) 143 (45 Fe) MG TBCR Take 1 each by mouth daily.   Levonorgest-Eth Est & Eth Est 42-21-21-7 DAYS TABS Take 1 tablet by mouth daily.   venlafaxine XR (EFFEXOR-XR) 150 MG 24 hr capsule Take 1 capsule (150 mg total) by mouth daily.   No facility-administered encounter medications on file as of 02/14/2023.    Past Medical History:  Diagnosis Date   Anemia    Anxiety    Depression    GERD (gastroesophageal reflux disease)    Thyroid disease    Tonsil stone    Vocal cord nodule     Past Surgical History:  Procedure Laterality Date   CHOLECYSTECTOMY  November 24 2020   NO PAST SURGERIES      Family History  Problem Relation Age of Onset   Lung cancer Maternal Grandfather    Cancer Maternal Grandfather    Breast cancer Paternal Grandmother 16       Malignant neoplasms, Estrogen Receptor positive   Asthma Paternal Grandmother    Cancer Paternal Grandmother    Heart disease Paternal Grandmother    Miscarriages /  Stillbirths Paternal Grandmother    Obesity Paternal Grandmother    Hypertension Father    Obesity Father    Obesity Paternal Grandfather    Asthma Brother    Obesity Brother    Depression Brother    Obesity Brother    Obesity Paternal Uncle     Social History   Socioeconomic History   Marital status: Single    Spouse name: Not on file   Number of children: Not on file   Years of education: Not on file   Highest education level: Not on file  Occupational History   Not on file  Tobacco Use   Smoking status: Never   Smokeless tobacco: Never  Vaping Use   Vaping status: Never Used  Substance and Sexual Activity   Alcohol use: No   Drug use: Yes    Frequency: 3.0 times per week     Comment: Delta 8   Sexual activity: Yes    Birth control/protection: Condom, Pill  Other Topics Concern   Not on file  Social History Narrative   Not on file   Social Determinants of Health   Financial Resource Strain: Not on file  Food Insecurity: Not on file  Transportation Needs: Not on file  Physical Activity: Inactive (04/23/2017)   Exercise Vital Sign    Days of Exercise per Week: 0 days    Minutes of Exercise per Session: 0 min  Stress: Stress Concern Present (04/23/2017)   [employer] of Occupational Health - Occupational Stress Questionnaire    Feeling of Stress : To some extent  Social Connections: Moderately Integrated (04/23/2017)   Social Connection and Isolation Panel [NHANES]    Frequency of Communication with Friends and Family: More than three times a week    Frequency of Social Gatherings with Friends and Family: More than three times a week    Attends Religious Services: More than 4 times per year    Active Member of [employer] or Organizations: Yes    Attends Banker Meetings: More than 4 times per year    Marital Status: Never married  Intimate Partner Violence: Not At Risk (04/23/2017)   Humiliation, Afraid, Rape, and Kick questionnaire    Fear of Current or Ex-Partner: No    Emotionally Abused: No    Physically Abused: No    Sexually Abused: No    Review of Systems  Constitutional:  Negative for chills and fever.  Respiratory:  Negative for shortness of breath.   Cardiovascular:  Negative for chest pain.        Objective    BP 116/72   Pulse (!) 105   Temp 98.1 F (36.7 C)   Resp 16   Ht 5\' 1"  (1.549 m)   Wt 263 lb 1.6 oz (119.3 kg)   LMP 01/29/2023   SpO2 98%   BMI 49.71 kg/m   Physical Exam Constitutional:      Appearance: Normal appearance.  HENT:     Head: Normocephalic and atraumatic.     Nose: Nose normal.  Eyes:     Conjunctiva/sclera: Conjunctivae normal.  Neck:     Comments: No thyromegaly   Cardiovascular:     Rate and Rhythm: Normal rate and regular rhythm.  Pulmonary:     Effort: Pulmonary effort is normal.     Breath sounds: Normal breath sounds.  Musculoskeletal:     Cervical back: No tenderness.  Lymphadenopathy:     Cervical: No cervical adenopathy.  Skin:  General: Skin is warm and dry.  Neurological:     General: No focal deficit present.     Mental Status: She is alert. Mental status is at baseline.  Psychiatric:        Mood and Affect: Mood normal.        Behavior: Behavior normal.        Assessment & Plan:   1. Episode of recurrent major depressive disorder, unspecified depression episode severity (HCC): PHQ elevated but patient states she is doing well on her current Effexor dose, will refill.  - venlafaxine XR (EFFEXOR-XR) 150 MG 24 hr capsule; Take 1 capsule (150 mg total) by mouth daily.  Dispense: 90 capsule; Refill: 1  2. Iron deficiency anemia, unspecified iron deficiency anemia type: Reviewed labs from last time with the patient, will refill iron.   - ferrous sulfate (EQL SLOW RELEASE IRON) 160 (50 Fe) MG TBCR SR tablet; Take 1 tablet (160 mg total) by mouth daily.  Dispense: 90 tablet; Refill: 1  3. Encounter for surveillance of contraceptive pills: Stable, no issues. Refills today. Will follow up in the next few weeks for repeat Pap.  - Levonorgest-Eth Est & Eth Est 42-21-21-7 DAYS TABS; Take 1 tablet by mouth daily.  Dispense: 91 tablet; Refill: 3  4. Need for HPV vaccine: Third and final HPV vaccine administered today.  - HPV 9-valent vaccine,Recombinat   Return in about 4 weeks (around 03/14/2023) for CPE w/Pap.   Margarita Mail, DO

## 2023-02-14 ENCOUNTER — Encounter: Payer: Self-pay | Admitting: Internal Medicine

## 2023-02-14 ENCOUNTER — Ambulatory Visit (INDEPENDENT_AMBULATORY_CARE_PROVIDER_SITE_OTHER): Payer: 59 | Admitting: Internal Medicine

## 2023-02-14 VITALS — BP 116/72 | HR 105 | Temp 98.1°F | Resp 16 | Ht 61.0 in | Wt 263.1 lb

## 2023-02-14 DIAGNOSIS — Z3041 Encounter for surveillance of contraceptive pills: Secondary | ICD-10-CM

## 2023-02-14 DIAGNOSIS — Z23 Encounter for immunization: Secondary | ICD-10-CM

## 2023-02-14 DIAGNOSIS — F339 Major depressive disorder, recurrent, unspecified: Secondary | ICD-10-CM

## 2023-02-14 DIAGNOSIS — D509 Iron deficiency anemia, unspecified: Secondary | ICD-10-CM

## 2023-02-14 MED ORDER — EQL SLOW RELEASE IRON 160 (50 FE) MG PO TBCR: 160.0000 mg | EXTENDED_RELEASE_TABLET | Freq: Every day | ORAL | 1 refills | Status: DC

## 2023-02-14 MED ORDER — VENLAFAXINE HCL ER 150 MG PO CP24: 150.0000 mg | ORAL_CAPSULE | Freq: Every day | ORAL | 1 refills | Status: DC

## 2023-02-14 MED ORDER — LEVONORGEST-ETH EST & ETH EST 42-21-21-7 DAYS PO TABS: 1.0000 | ORAL_TABLET | Freq: Every day | ORAL | 3 refills | Status: DC

## 2023-03-13 NOTE — Progress Notes (Signed)
Name: Tina [name]   MRN: [id number]    DOB: 2000/07/24   Date:03/14/2023       Progress Note  Subjective  Chief Complaint  Chief Complaint  Patient presents with   Annual Exam    HPI  Patient presents for annual CPE.  Diet: not well rounded - cooks at home, eating chicken, vegetables Exercise: walks a lot at work, no regular exercise   Last Eye Exam: UTD Last Dental Exam: Looking for a new dentist   Flowsheet Row Office Visit from 03/14/2023 in [facility]  AUDIT-C Score 1      Depression: Phq 9 is  improved, stable on Effexor    03/14/2023   10:11 AM 02/14/2023    9:25 AM 10/07/2022    9:47 AM 08/15/2022   11:01 AM 12/11/2020   11:32 AM  Depression screen PHQ 2/9  Decreased Interest 1 1 0 3 1  Down, Depressed, Hopeless 1 1 0 2 1  PHQ - 2 Score 2 2 0 5 2  Altered sleeping 1 2 0 3 1  Tired, decreased energy 1 3 0 3 2  Change in appetite 1 3 0 3 1  Feeling bad or failure about yourself  0 2 0 2 1  Trouble concentrating 0 2 0 2 2  Moving slowly or fidgety/restless 0 0 0 0 0  Suicidal thoughts 0 0 0 0 0  PHQ-9 Score 5 14 0 18 9  Difficult doing work/chores Very difficult Somewhat difficult Not difficult at all Somewhat difficult Somewhat difficult   Hypertension: BP Readings from Last 3 Encounters:  03/14/23 118/74  02/14/23 116/72  10/07/22 124/64   Obesity: Wt Readings from Last 3 Encounters:  03/14/23 261 lb 3.2 oz (118.5 kg)  02/14/23 263 lb 1.6 oz (119.3 kg)  10/07/22 257 lb 3.2 oz (116.7 kg)   BMI Readings from Last 3 Encounters:  03/14/23 49.35 kg/m  02/14/23 49.71 kg/m  10/07/22 48.60 kg/m     Vaccines:   HPV: completed Tdap: 10/02/17 Shingrix: N/a Pneumonia: N/a Flu: UTD, got at work  COVID-19:2 doses   Hep C Screening: complete STD testing and prevention (HIV/chl/gon/syphilis): complete; no acute concerns Intimate partner violence: negative screen  LMP: regular, 3 weeks - on OCP's and doing well  Discussed  importance of follow up if any post-menopausal bleeding: not applicable  Incontinence Symptoms: negative for symptoms   Breast cancer:  - Last Mammogram: Discussed screening at age 16  Osteoporosis Prevention : Discussed high calcium and vitamin D supplementation, weight bearing exercises Bone density :not applicable   Cervical cancer screening: due/ordered; ASCUS last year  Skin cancer: Discussed monitoring for atypical lesions  Colorectal cancer: Discussed screening at age 63 Lung cancer:  Low Dose CT Chest recommended if Age 19-80 years, 20 pack-year currently smoking OR have quit w/in 15years. Patient does not qualify for screen     Advanced Care Planning: A voluntary discussion about advance care planning including the explanation and discussion of advance directives.  Discussed health care proxy and Living will, and the patient was able to identify a health care proxy as parents - mother Kimberly [name] or father Brian [name].  Patient does not have a living will and power of attorney of health care   Lipids: No results found for: "CHOL" No results found for: "HDL" No results found for: "LDLCALC" No results found for: "TRIG" No results found for: "CHOLHDL" No results found for: "LDLDIRECT"  Glucose: No results found for: "  GLUCOSE", "GLUCAP"  Patient Active Problem List   Diagnosis Date Noted   Episode of recurrent major depressive disorder (HCC) 02/14/2023   Hypothyroidism 09/12/2020    Past Surgical History:  Procedure Laterality Date   CHOLECYSTECTOMY  November 24 2020   NO PAST SURGERIES      Family History  Problem Relation Age of Onset   Lung cancer Maternal Grandfather    Cancer Maternal Grandfather    Breast cancer Paternal Grandmother 73       Malignant neoplasms, Estrogen Receptor positive   Asthma Paternal Grandmother    Cancer Paternal Grandmother    Heart disease Paternal Grandmother    Miscarriages / Stillbirths Paternal Grandmother    Obesity  Paternal Grandmother    Hypertension Father    Obesity Father    Obesity Paternal Grandfather    Asthma Brother    Obesity Brother    Depression Brother    Obesity Brother    Obesity Paternal Uncle     Social History   Socioeconomic History   Marital status: Single    Spouse name: Not on file   Number of children: Not on file   Years of education: Not on file   Highest education level: Not on file  Occupational History   Not on file  Tobacco Use   Smoking status: Never   Smokeless tobacco: Never  Vaping Use   Vaping status: Never Used  Substance and Sexual Activity   Alcohol use: No   Drug use: Yes    Frequency: 3.0 times per week    Comment: Delta 8   Sexual activity: Yes    Birth control/protection: Condom, Pill  Other Topics Concern   Not on file  Social History Narrative   Not on file   Social Determinants of Health   Financial Resource Strain: Low Risk  (03/14/2023)   Overall Financial Resource Strain (CARDIA)    Difficulty of Paying Living Expenses: Not hard at all  Food Insecurity: No Food Insecurity (03/14/2023)   Hunger Vital Sign    Worried About Running Out of Food in the Last Year: Never true    Ran Out of Food in the Last Year: Never true  Transportation Needs: No Transportation Needs (03/14/2023)   PRAPARE - Administrator, Civil Service (Medical): No    Lack of Transportation (Non-Medical): No  Physical Activity: Insufficiently Active (03/14/2023)   Exercise Vital Sign    Days of Exercise per Week: 3 days    Minutes of Exercise per Session: 30 min  Stress: Stress Concern Present (03/14/2023)   [employer] of Occupational Health - Occupational Stress Questionnaire    Feeling of Stress : To some extent  Social Connections: Moderately Isolated (03/14/2023)   Social Connection and Isolation Panel [NHANES]    Frequency of Communication with Friends and Family: More than three times a week    Frequency of Social Gatherings with Friends  and Family: More than three times a week    Attends Religious Services: More than 4 times per year    Active Member of [employer] or Organizations: No    Attends Banker Meetings: Never    Marital Status: Never married  Intimate Partner Violence: Not At Risk (03/14/2023)   Humiliation, Afraid, Rape, and Kick questionnaire    Fear of Current or Ex-Partner: No    Emotionally Abused: No    Physically Abused: No    Sexually Abused: No     Current Outpatient Medications:  ferrous sulfate (EQL SLOW RELEASE IRON) 160 (50 Fe) MG TBCR SR tablet, Take 1 tablet (160 mg total) by mouth daily., Disp: 90 tablet, Rfl: 1   Ferrous Sulfate (IRON SLOW RELEASE) 143 (45 Fe) MG TBCR, Take 1 each by mouth daily., Disp: 90 tablet, Rfl: 1   Levonorgest-Eth Est & Eth Est 42-21-21-7 DAYS TABS, Take 1 tablet by mouth daily., Disp: 91 tablet, Rfl: 3   venlafaxine XR (EFFEXOR-XR) 150 MG 24 hr capsule, Take 1 capsule (150 mg total) by mouth daily., Disp: 90 capsule, Rfl: 1  Allergies  Allergen Reactions   Latex Itching and Swelling     Review of Systems  All other systems reviewed and are negative.    Objective  Vitals:   03/14/23 1018  BP: 118/74  Pulse: (!) 111  Resp: 16  Temp: 98.1 F (36.7 C)  TempSrc: Oral  SpO2: 98%  Weight: 261 lb 3.2 oz (118.5 kg)  Height: 5\' 1"  (1.549 m)    Body mass index is 49.35 kg/m.  Physical Exam Exam conducted with a chaperone present.  Constitutional:      Appearance: Normal appearance.  HENT:     Head: Normocephalic and atraumatic.  Eyes:     Conjunctiva/sclera: Conjunctivae normal.  Cardiovascular:     Rate and Rhythm: Normal rate and regular rhythm.  Pulmonary:     Effort: Pulmonary effort is normal.     Breath sounds: Normal breath sounds.  Chest:  Breasts:    Right: Normal.     Left: Normal.  Genitourinary:    Comments: External genitalia within normal limits.  Vaginal mucosa pink, moist, normal rugae.  Nonfriable cervix without  lesions, no discharge or bleeding noted on speculum exam.   Lymphadenopathy:     Upper Body:     Right upper body: No supraclavicular, axillary or pectoral adenopathy.     Left upper body: No supraclavicular, axillary or pectoral adenopathy.  Skin:    General: Skin is warm and dry.  Neurological:     General: No focal deficit present.     Mental Status: She is alert. Mental status is at baseline.  Psychiatric:        Mood and Affect: Mood normal.        Behavior: Behavior normal.     No results found for this or any previous visit (from the past 2160 hour(s)).   Fall Risk:    03/14/2023   10:10 AM 02/14/2023    9:23 AM 10/07/2022    9:36 AM 08/15/2022   11:01 AM 09/12/2020   10:18 AM  Fall Risk   Falls in the past year? 0 0 0 0 0  Number falls in past yr: 0 0 0 0   Injury with Fall? 0 0 0 0   Risk for fall due to : No Fall Risks      Follow up Falls prevention discussed;Education provided;Falls evaluation completed        Functional Status Survey: Is the patient deaf or have difficulty hearing?: No Does the patient have difficulty seeing, even when wearing glasses/contacts?: No Does the patient have difficulty concentrating, remembering, or making decisions?: No Does the patient have difficulty walking or climbing stairs?: No Does the patient have difficulty dressing or bathing?: No Does the patient have difficulty doing errands alone such as visiting a doctor's office or shopping?: No   Assessment & Plan  1. Annual physical exam/Lipid screening/Iron deficiency anemia, unspecified iron deficiency anemia type: Physical exam completed, health maintenance  reviewed and annual labs ordered.   - CBC w/Diff/Platelet - COMPLETE METABOLIC PANEL WITH GFR - Lipid Profile  2. Screening for cervical cancer: Repeat Pap today.  - Cytology - PAP  -USPSTF grade A and B recommendations reviewed with patient; age-appropriate recommendations, preventive care, screening tests, etc  discussed and encouraged; healthy living encouraged; see AVS for patient education given to patient -Discussed importance of 150 minutes of physical activity weekly, eat two servings of fish weekly, eat one serving of tree nuts ( cashews, pistachios, pecans, almonds.Marland Kitchen) every other day, eat 6 servings of fruit/vegetables daily and drink plenty of water and avoid sweet beverages.   -Reviewed Health Maintenance: Yes.

## 2023-03-14 ENCOUNTER — Other Ambulatory Visit (HOSPITAL_COMMUNITY)
Admission: RE | Admit: 2023-03-14 | Discharge: 2023-03-14 | Disposition: A | Discharge status: Home / Self Care | Payer: 59 | Source: Ambulatory Visit | Attending: Internal Medicine | Admitting: Internal Medicine

## 2023-03-14 ENCOUNTER — Encounter: Payer: Self-pay | Admitting: Internal Medicine

## 2023-03-14 ENCOUNTER — Ambulatory Visit (INDEPENDENT_AMBULATORY_CARE_PROVIDER_SITE_OTHER): Payer: 59 | Admitting: Internal Medicine

## 2023-03-14 VITALS — BP 118/74 | HR 111 | Temp 98.1°F | Resp 16 | Ht 61.0 in | Wt 261.2 lb

## 2023-03-14 DIAGNOSIS — Z0000 Encounter for general adult medical examination without abnormal findings: Secondary | ICD-10-CM

## 2023-03-14 DIAGNOSIS — Z124 Encounter for screening for malignant neoplasm of cervix: Secondary | ICD-10-CM | POA: Insufficient documentation

## 2023-03-14 DIAGNOSIS — Z13220 Encounter for screening for lipoid disorders: Secondary | ICD-10-CM | POA: Diagnosis not present

## 2023-03-14 DIAGNOSIS — D509 Iron deficiency anemia, unspecified: Secondary | ICD-10-CM

## 2023-03-15 LAB — COMPLETE METABOLIC PANEL WITH GFR
AG Ratio: 1.2 (calc) (ref 1.0–2.5)
ALT: 16 U/L (ref 6–29)
AST: 14 U/L (ref 10–30)
Albumin: 4 g/dL (ref 3.6–5.1)
Alkaline phosphatase (APISO): 76 U/L (ref 31–125)
BUN: 9 mg/dL (ref 7–25)
CO2: 25 mmol/L (ref 20–32)
Calcium: 9.6 mg/dL (ref 8.6–10.2)
Chloride: 102 mmol/L (ref 98–110)
Creat: 0.72 mg/dL (ref 0.50–0.96)
Globulin: 3.4 g/dL (ref 1.9–3.7)
Glucose, Bld: 85 mg/dL (ref 65–99)
Potassium: 4.6 mmol/L (ref 3.5–5.3)
Sodium: 138 mmol/L (ref 135–146)
Total Bilirubin: 0.3 mg/dL (ref 0.2–1.2)
Total Protein: 7.4 g/dL (ref 6.1–8.1)
eGFR: 120 mL/min/{1.73_m2} (ref >=60)

## 2023-03-15 LAB — LIPID PANEL
Cholesterol: 206 mg/dL — ABNORMAL HIGH (ref <200)
HDL: 53 mg/dL (ref >=50)
LDL Cholesterol (Calc): 134 mg/dL — ABNORMAL HIGH
Non-HDL Cholesterol (Calc): 153 mg/dL — ABNORMAL HIGH (ref <130)
Total CHOL/HDL Ratio: 3.9 (calc) (ref <5.0)
Triglycerides: 87 mg/dL (ref <150)

## 2023-03-15 LAB — CBC WITH DIFFERENTIAL/PLATELET
Absolute Lymphocytes: 2379 {cells}/uL (ref 850–3900)
Absolute Monocytes: 374 {cells}/uL (ref 200–950)
Basophils Absolute: 62 {cells}/uL (ref 0–200)
Basophils Relative: 0.8 %
Eosinophils Absolute: 133 {cells}/uL (ref 15–500)
Eosinophils Relative: 1.7 %
HCT: 40.3 % (ref 35.0–45.0)
Hemoglobin: 13 g/dL (ref 11.7–15.5)
MCH: 27.9 pg (ref 27.0–33.0)
MCHC: 32.3 g/dL (ref 32.0–36.0)
MCV: 86.5 fL (ref 80.0–100.0)
MPV: 10.4 fL (ref 7.5–12.5)
Monocytes Relative: 4.8 %
Neutro Abs: 4852 {cells}/uL (ref 1500–7800)
Neutrophils Relative %: 62.2 %
Platelets: 389 10*3/uL (ref 140–400)
RBC: 4.66 10*6/uL (ref 3.80–5.10)
RDW: 13 % (ref 11.0–15.0)
Total Lymphocyte: 30.5 %
WBC: 7.8 10*3/uL (ref 3.8–10.8)

## 2023-03-18 LAB — CYTOLOGY - PAP
Adequacy: ABSENT
Diagnosis: NEGATIVE

## 2023-05-06 ENCOUNTER — Encounter: Payer: Self-pay | Admitting: Internal Medicine

## 2023-05-14 ENCOUNTER — Encounter: Payer: Self-pay | Admitting: Internal Medicine

## 2023-05-14 NOTE — Telephone Encounter (Signed)
Please schedule patient appointment

## 2023-05-20 ENCOUNTER — Encounter: Payer: Self-pay | Admitting: Internal Medicine

## 2023-05-20 ENCOUNTER — Other Ambulatory Visit: Payer: Self-pay

## 2023-05-20 ENCOUNTER — Ambulatory Visit: Payer: 59 | Admitting: Internal Medicine

## 2023-05-20 VITALS — BP 120/82 | HR 106 | Temp 98.3°F | Resp 16 | Ht 61.0 in | Wt 248.4 lb

## 2023-05-20 DIAGNOSIS — K580 Irritable bowel syndrome with diarrhea: Secondary | ICD-10-CM | POA: Diagnosis not present

## 2023-05-20 NOTE — Progress Notes (Signed)
   Acute Office Visit  Subjective:     Patient ID: Tina [name], female    DOB: April 03, 2001, 23 y.o.   MRN: [id number]  Chief Complaint  Patient presents with   Abdominal Pain    After eating    HPI Patient is in today for diarrhea. She states she was having both diarrhea and vomiting back in December, her other symptoms resolved but she is still having loose stools multiple times a day. She denies abdominal pain but will have a loose stool every time she eats, about 20-30 minutes after eating. She denies blood or mucus in the stools. Diet has not changed, trying to eat healthier. Does have a history of gallbladder surgery. Denies recent stress.   Review of Systems  Constitutional:  Negative for chills and fever.  Gastrointestinal:  Positive for diarrhea. Negative for abdominal pain, blood in stool, heartburn, melena, nausea and vomiting.  Genitourinary:  Negative for dysuria, frequency and urgency.        Objective:    BP 120/82 (Cuff Size: Large)   Pulse (!) 106   Temp 98.3 F (36.8 C) (Oral)   Resp 16   Ht 5' 1 (1.549 m)   Wt 248 lb 6.4 oz (112.7 kg)   SpO2 99%   BMI 46.93 kg/m  BP Readings from Last 3 Encounters:  05/20/23 120/82  03/14/23 118/74  02/14/23 116/72   Wt Readings from Last 3 Encounters:  05/20/23 248 lb 6.4 oz (112.7 kg)  03/14/23 261 lb 3.2 oz (118.5 kg)  02/14/23 263 lb 1.6 oz (119.3 kg)      Physical Exam Constitutional:      Appearance: Normal appearance.  HENT:     Head: Normocephalic and atraumatic.  Eyes:     Conjunctiva/sclera: Conjunctivae normal.  Cardiovascular:     Rate and Rhythm: Normal rate and regular rhythm.  Pulmonary:     Effort: Pulmonary effort is normal.     Breath sounds: Normal breath sounds.  Abdominal:     General: Bowel sounds are normal. There is no distension.     Palpations: Abdomen is soft. There is no mass.     Tenderness: There is no abdominal tenderness. There is no guarding or rebound.     Hernia:  No hernia is present.  Skin:    General: Skin is warm and dry.  Neurological:     General: No focal deficit present.     Mental Status: She is alert. Mental status is at baseline.  Psychiatric:        Mood and Affect: Mood normal.        Behavior: Behavior normal.     No results found for any visits on 05/20/23.      Assessment & Plan:   1. Irritable bowel syndrome with diarrhea (Primary): Symptoms consistent with IBS, discussed and printed low FOD-MAP diet, as well as starting a daily probiotic and increasing hydration and dietary fiber. May also need to start a fiber supplement but will start low and increase slowly. Patient will follow up if symptoms worsen or fail to improve.    Return for already scheduled.  Sharyle Fischer, DO

## 2023-05-20 NOTE — Patient Instructions (Addendum)
 It was great seeing you today!  Plan discussed at today's visit: -Try low FOD-MAP diet -Start probiotic, recommend Align brand -Can also start fiber supplements - start low and increase slowly   Follow up in: already scheduled for November or sooner as needed  Take care and let us  know if you have any questions or concerns prior to your next visit.  Dr. Bernardo  Low-FODMAP Eating Plan  FODMAP stands for fermentable oligosaccharides, disaccharides, monosaccharides, and polyols. These are sugars that are hard for some people to digest. A low-FODMAP eating plan may help some people who have irritable bowel syndrome (IBS) and certain other bowel (intestinal) diseases to manage their symptoms. This meal plan can be complicated to follow. Work with a diet and nutrition specialist (dietitian) to make a low-FODMAP eating plan that is right for you. A dietitian can help make sure that you get enough nutrition from this diet. What are tips for following this plan? Reading food labels Check labels for hidden FODMAPs such as: High-fructose syrup. Honey. Agave. Natural fruit flavors. Onion or garlic powder. Choose low-FODMAP foods that contain 3-4 grams of fiber per serving. Check food labels for serving sizes. Eat only one serving at a time to make sure FODMAP levels stay low. Shopping Shop with a list of foods that are recommended on this diet and make a meal plan. Meal planning Follow a low-FODMAP eating plan for up to 6 weeks, or as told by your health care provider or dietitian. To follow the eating plan: Eliminate high-FODMAP foods from your diet completely. Choose only low-FODMAP foods to eat. You will do this for 2-6 weeks. Gradually reintroduce high-FODMAP foods into your diet one at a time. Most people should wait a few days before introducing the next new high-FODMAP food into their meal plan. Your dietitian can recommend how quickly you may reintroduce foods. Keep a daily record of  what and how much you eat and drink. Make note of any symptoms that you have after eating. Review your daily record with a dietitian regularly to identify which foods you can eat and which foods you should avoid. General tips Drink enough fluid each day to keep your urine pale yellow. Avoid processed foods. These often have added sugar and may be high in FODMAPs. Avoid most dairy products, whole grains, and sweeteners. Work with a dietitian to make sure you get enough fiber in your diet. Avoid high FODMAP foods at meals to manage symptoms. Recommended foods Fruits Bananas, oranges, tangerines, lemons, limes, blueberries, raspberries, strawberries, grapes, cantaloupe, honeydew melon, kiwi, papaya, passion fruit, and pineapple. Limited amounts of dried cranberries, banana chips, and shredded coconut. Vegetables Eggplant, zucchini, cucumber, peppers, green beans, bean sprouts, lettuce, arugula, kale, Swiss chard, spinach, collard greens, bok choy, summer squash, potato, and tomato. Limited amounts of corn, carrot, and sweet potato. Green parts of scallions. Grains Gluten-free grains, such as rice, oats, buckwheat, quinoa, corn, polenta, and millet. Gluten-free pasta, bread, or cereal. Rice noodles. Corn tortillas. Meats and other proteins Unseasoned beef, pork, poultry, or fish. Eggs. [place]. Tofu (firm) and tempeh. Limited amounts of nuts and seeds, such as almonds, walnuts, brazil nuts, pecans, peanuts, nut butters, pumpkin seeds, chia seeds, and sunflower seeds. Dairy Lactose-free milk, yogurt, and kefir. Lactose-free cottage cheese and ice cream. Non-dairy milks, such as almond, coconut, hemp, and rice milk. Non-dairy yogurt. Limited amounts of goat cheese, brie, mozzarella, parmesan, swiss, and other hard cheeses. Fats and oils Butter-free spreads. Vegetable oils, such as olive, canola, and  sunflower oil. Seasoning and other foods Artificial sweeteners with names that do not end in ol, such  as aspartame, saccharine, and stevia. Maple syrup, white table sugar, raw sugar, brown sugar, and molasses. Mayonnaise, soy sauce, and tamari. Fresh basil, coriander, parsley, rosemary, and thyme. Beverages Water and mineral water. Sugar-sweetened soft drinks. Small amounts of orange juice or cranberry juice. Black and green tea. Most dry wines. Coffee. The items listed above may not be a complete list of foods and beverages you can eat. Contact a dietitian for more information. Foods to avoid Fruits Fresh, dried, and juiced forms of apple, pear, watermelon, peach, plum, cherries, apricots, blackberries, boysenberries, figs, nectarines, and mango. Avocado. Vegetables Chicory root, artichoke, asparagus, cabbage, snow peas, Brussels sprouts, broccoli, sugar snap peas, mushrooms, celery, and cauliflower. Onions, garlic, leeks, and the white part of scallions. Grains Wheat, including kamut, durum, and semolina. Barley and bulgur. Couscous. Wheat-based cereals. Wheat noodles, bread, crackers, and pastries. Meats and other proteins Fried or fatty meat. Sausage. Cashews and pistachios. Soybeans, baked beans, black beans, chickpeas, kidney beans, fava beans, navy beans, lentils, black-eyed peas, and split peas. Dairy Milk, yogurt, ice cream, and soft cheese. Cream and sour cream. Milk-based sauces. Custard. Buttermilk. Soy milk. Seasoning and other foods Any sugar-free gum or candy. Foods that contain artificial sweeteners such as sorbitol, mannitol, isomalt, or xylitol. Foods that contain honey, high-fructose corn syrup, or agave. Bouillon, vegetable stock, beef stock, and chicken stock. Garlic and onion powder. Condiments made with onion, such as hummus, chutney, pickles, relish, salad dressing, and salsa. Tomato paste. Beverages Chicory-based drinks. Coffee substitutes. Chamomile tea. Fennel tea. Sweet or fortified wines such as port or sherry. Diet soft drinks made with isomalt, mannitol, maltitol,  sorbitol, or xylitol. Apple, pear, and mango juice. Juices with high-fructose corn syrup. The items listed above may not be a complete list of foods and beverages you should avoid. Contact a dietitian for more information. Summary FODMAP stands for fermentable oligosaccharides, disaccharides, monosaccharides, and polyols. These are sugars that are hard for some people to digest. A low-FODMAP eating plan is a short-term diet that helps to ease symptoms of certain bowel diseases. The eating plan usually lasts up to 6 weeks. After that, high-FODMAP foods are reintroduced gradually and one at a time. This can help you find out which foods may be causing symptoms. A low-FODMAP eating plan can be complicated. It is best to work with a dietitian who has experience with this type of plan. This information is not intended to replace advice given to you by your health care provider. Make sure you discuss any questions you have with your health care provider. Document Revised: 09/09/2019 Document Reviewed: 09/09/2019 Elsevier Patient Education  2024 [employer].

## 2023-09-05 ENCOUNTER — Other Ambulatory Visit: Payer: Self-pay | Admitting: Internal Medicine

## 2023-09-05 DIAGNOSIS — F339 Major depressive disorder, recurrent, unspecified: Secondary | ICD-10-CM

## 2023-09-08 NOTE — Telephone Encounter (Signed)
 Requested Prescriptions  Pending Prescriptions Disp Refills   venlafaxine  XR (EFFEXOR -XR) 150 MG 24 hr capsule [Pharmacy Med Name: VENLAFAXINE  ER 150MG  CAPSULES] 90 capsule 1    Sig: TAKE 1 CAPSULE(150 MG) BY MOUTH DAILY     Psychiatry: Antidepressants - SNRI - desvenlafaxine & venlafaxine  Failed - 09/08/2023  8:29 PM      Failed - Valid encounter within last 6 months    Recent Outpatient Visits   None     Future Appointments             In 6 months Rockney Cid, DO [facility]            Failed - Lipid Panel in normal range within the last 12 months    Cholesterol  Date Value Ref Range Status  03/14/2023 206 (H) <200 mg/dL Final   LDL Cholesterol (Calc)  Date Value Ref Range Status  03/14/2023 134 (H) mg/dL (calc) Final    Comment:    Reference range: <100 . Desirable range <100 mg/dL for primary prevention;   <70 mg/dL for patients with CHD or diabetic patients  with > or = 2 CHD risk factors. Aaron Aas LDL-C is now calculated using the Martin-Hopkins  calculation, which is a validated novel method providing  better accuracy than the Friedewald equation in the  estimation of LDL-C.  Melinda Sprawls et al. Erroll Heard. 0454;098(11): 2061-2068  (http://education.[employer].com/faq/FAQ164)    HDL  Date Value Ref Range Status  03/14/2023 53 > OR = 50 mg/dL Final   Triglycerides  Date Value Ref Range Status  03/14/2023 87 <150 mg/dL Final         Passed - Cr in normal range and within 360 days    Creat  Date Value Ref Range Status  03/14/2023 0.72 0.50 - 0.96 mg/dL Final         Passed - Completed PHQ-2 or PHQ-9 in the last 360 days      Passed - Last BP in normal range    BP Readings from Last 1 Encounters:  05/20/23 120/82

## 2023-12-11 ENCOUNTER — Encounter: Payer: Self-pay | Admitting: Internal Medicine

## 2023-12-12 ENCOUNTER — Other Ambulatory Visit: Payer: Self-pay | Admitting: Internal Medicine

## 2023-12-12 DIAGNOSIS — Z111 Encounter for screening for respiratory tuberculosis: Secondary | ICD-10-CM

## 2023-12-18 ENCOUNTER — Encounter: Payer: Self-pay | Admitting: Internal Medicine

## 2023-12-24 NOTE — Telephone Encounter (Signed)
 Faxed forms to the fax # given in message and gave CMA the paperwork to upload in [place].

## 2023-12-24 NOTE — Telephone Encounter (Signed)
 Copied from CRM #[id number]. Topic: General - Other >> Dec 24, 2023 10:30 AM Montie POUR wrote: Reason for CRM:  Tina [name] wants to know if clinic can fax her form for her physical to her mom at [facility]? The fax number is [phone].  Also, can you put form in her MyChart? Please send her a message through MyChart when this is done. Thanks

## 2023-12-24 NOTE — Telephone Encounter (Signed)
 Called pt to inform her that paperwork was faxed to her mom and a copy will be sent to the scan center to scan. Pt did not wish to have a copy mailed.

## 2024-03-08 ENCOUNTER — Other Ambulatory Visit: Payer: Self-pay | Admitting: Internal Medicine

## 2024-03-08 DIAGNOSIS — F339 Major depressive disorder, recurrent, unspecified: Secondary | ICD-10-CM

## 2024-03-08 NOTE — Telephone Encounter (Unsigned)
 Copied from CRM [id number]. Topic: Clinical - Medication Refill >> Mar 08, 2024 11:10 AM Avram MATSU wrote: Medication: venlafaxine  XR (EFFEXOR -XR) 150 MG 24 hr capsule [[id number]]  Has the patient contacted their pharmacy? No (Agent: If no, request that the patient contact the pharmacy for the refill. If patient does not wish to contact the pharmacy document the reason why and proceed with request.) (Agent: If yes, when and what did the pharmacy advise?)  This is the patient's preferred pharmacy:  [facility]  [address] [place], [address] Phone:[phone]  Is this the correct pharmacy for this prescription? Yes If no, delete pharmacy and type the correct one.   Has the prescription been filled recently? No  Is the patient out of the medication? No 3-4 days left  Has the patient been seen for an appointment in the last year OR does the patient have an upcoming appointment? Yes  Can we respond through MyChart? Yes  Agent: Please be advised that Rx refills may take up to 3 business days. We ask that you follow-up with your pharmacy.

## 2024-03-09 MED ORDER — VENLAFAXINE HCL ER 150 MG PO CP24
150.0000 mg | ORAL_CAPSULE | Freq: Every day | ORAL | 0 refills | Status: DC
Start: 2024-03-09 — End: 2024-03-10

## 2024-03-09 NOTE — Telephone Encounter (Signed)
 Requested medication (s) are due for refill today - yes  Requested medication (s) are on the active medication list -yes  Future visit scheduled -yes 04/19/24  Last refill: 09/08/23 #90 1RF  Notes to clinic: patient is iver due 6 month f/u- does have appointment scheduled- ok to RF?  Requested Prescriptions  Pending Prescriptions Disp Refills   venlafaxine  XR (EFFEXOR -XR) 150 MG 24 hr capsule 90 capsule 1     Psychiatry: Antidepressants - SNRI - desvenlafaxine & venlafaxine  Failed - 03/09/2024  4:12 PM      Failed - Cr in normal range and within 360 days    Creat  Date Value Ref Range Status  03/14/2023 0.72 0.50 - 0.96 mg/dL Final         Failed - Completed PHQ-2 or PHQ-9 in the last 360 days      Failed - Valid encounter within last 6 months    Recent Outpatient Visits   None            Failed - Lipid Panel in normal range within the last 12 months    Cholesterol  Date Value Ref Range Status  03/14/2023 206 (H) <200 mg/dL Final   LDL Cholesterol (Calc)  Date Value Ref Range Status  03/14/2023 134 (H) mg/dL (calc) Final    Comment:    Reference range: <100 . Desirable range <100 mg/dL for primary prevention;   <70 mg/dL for patients with CHD or diabetic patients  with > or = 2 CHD risk factors. SABRA LDL-C is now calculated using the Martin-Hopkins  calculation, which is a validated novel method providing  better accuracy than the Friedewald equation in the  estimation of LDL-C.  Gladis APPLETHWAITE et al. SANDREA. 7986;689(80): 2061-2068  (http://education.[facility].com/faq/FAQ164)    HDL  Date Value Ref Range Status  03/14/2023 53 > OR = 50 mg/dL Final   Triglycerides  Date Value Ref Range Status  03/14/2023 87 <150 mg/dL Final         Passed - Last BP in normal range    BP Readings from Last 1 Encounters:  05/20/23 120/82            Requested Prescriptions  Pending Prescriptions Disp Refills   venlafaxine  XR (EFFEXOR -XR) 150 MG 24 hr capsule 90  capsule 1     Psychiatry: Antidepressants - SNRI - desvenlafaxine & venlafaxine  Failed - 03/09/2024  4:12 PM      Failed - Cr in normal range and within 360 days    Creat  Date Value Ref Range Status  03/14/2023 0.72 0.50 - 0.96 mg/dL Final         Failed - Completed PHQ-2 or PHQ-9 in the last 360 days      Failed - Valid encounter within last 6 months    Recent Outpatient Visits   None            Failed - Lipid Panel in normal range within the last 12 months    Cholesterol  Date Value Ref Range Status  03/14/2023 206 (H) <200 mg/dL Final   LDL Cholesterol (Calc)  Date Value Ref Range Status  03/14/2023 134 (H) mg/dL (calc) Final    Comment:    Reference range: <100 . Desirable range <100 mg/dL for primary prevention;   <70 mg/dL for patients with CHD or diabetic patients  with > or = 2 CHD risk factors. SABRA LDL-C is now calculated using the Martin-Hopkins  calculation, which is a validated novel method providing  better  accuracy than the Friedewald equation in the  estimation of LDL-C.  Gladis APPLETHWAITE et al. SANDREA. 7986;689(80): 2061-2068  (http://education.[facility].com/faq/FAQ164)    HDL  Date Value Ref Range Status  03/14/2023 53 > OR = 50 mg/dL Final   Triglycerides  Date Value Ref Range Status  03/14/2023 87 <150 mg/dL Final         Passed - Last BP in normal range    BP Readings from Last 1 Encounters:  05/20/23 120/82

## 2024-03-10 ENCOUNTER — Encounter: Payer: Self-pay | Admitting: Internal Medicine

## 2024-03-10 DIAGNOSIS — Z3041 Encounter for surveillance of contraceptive pills: Secondary | ICD-10-CM

## 2024-03-10 DIAGNOSIS — F339 Major depressive disorder, recurrent, unspecified: Secondary | ICD-10-CM

## 2024-03-10 MED ORDER — LEVONORGEST-ETH EST & ETH EST 42-21-21-7 DAYS PO TABS: 1.0000 | ORAL_TABLET | Freq: Every day | ORAL | 0 refills | Status: DC

## 2024-03-10 MED ORDER — VENLAFAXINE HCL ER 150 MG PO CP24: 150.0000 mg | ORAL_CAPSULE | Freq: Every day | ORAL | 0 refills | Status: DC

## 2024-03-15 ENCOUNTER — Ambulatory Visit: Payer: Self-pay | Admitting: Internal Medicine

## 2024-04-19 ENCOUNTER — Encounter: Payer: Self-pay | Admitting: Internal Medicine

## 2024-04-19 ENCOUNTER — Ambulatory Visit: Admitting: Internal Medicine

## 2024-04-19 ENCOUNTER — Other Ambulatory Visit (HOSPITAL_COMMUNITY)
Admission: RE | Admit: 2024-04-19 | Discharge: 2024-04-19 | Disposition: A | Source: Ambulatory Visit | Attending: Internal Medicine | Admitting: Internal Medicine

## 2024-04-19 VITALS — BP 122/78 | HR 100 | Temp 98.0°F | Resp 18 | Ht 61.0 in | Wt 238.0 lb

## 2024-04-19 DIAGNOSIS — F339 Major depressive disorder, recurrent, unspecified: Secondary | ICD-10-CM

## 2024-04-19 DIAGNOSIS — Z113 Encounter for screening for infections with a predominantly sexual mode of transmission: Secondary | ICD-10-CM

## 2024-04-19 DIAGNOSIS — Z13220 Encounter for screening for lipoid disorders: Secondary | ICD-10-CM

## 2024-04-19 DIAGNOSIS — Z23 Encounter for immunization: Secondary | ICD-10-CM

## 2024-04-19 DIAGNOSIS — Z0001 Encounter for general adult medical examination with abnormal findings: Secondary | ICD-10-CM | POA: Diagnosis not present

## 2024-04-19 DIAGNOSIS — Z3041 Encounter for surveillance of contraceptive pills: Secondary | ICD-10-CM

## 2024-04-19 DIAGNOSIS — Z0000 Encounter for general adult medical examination without abnormal findings: Secondary | ICD-10-CM

## 2024-04-19 MED ORDER — VENLAFAXINE HCL ER 150 MG PO CP24: 150.0000 mg | ORAL_CAPSULE | Freq: Every day | ORAL | 3 refills | Status: AC

## 2024-04-19 MED ORDER — LEVONORGEST-ETH EST & ETH EST 42-21-21-7 DAYS PO TABS: 1.0000 | ORAL_TABLET | Freq: Every day | ORAL | 2 refills | Status: AC

## 2024-04-19 NOTE — Progress Notes (Signed)
 Name: Tina [name]   MRN: [id number]    DOB: [birth year]   Date:04/19/2024       Progress Note  Subjective  Chief Complaint  Chief Complaint  Patient presents with   Annual Exam    HPI  Patient presents for annual CPE.  Discussed the use of AI scribe software for clinical note transcription with the patient, who gave verbal consent to proceed.  History of Present Illness Tina [name] is a 23 year old female who presents for an annual physical exam and medication refills.  She takes venlafaxine  150 mg daily for the past four years and feels well on this regimen. She takes birth control in a three-month pack without side effects.  Over the past year she increased protein intake and exercises with weightlifting about four days per week. She has lost about 40 pounds since February, from 261 to 238 pounds.  She is planning a trip to [place] as part of her master's program and is interested in receiving hepatitis A vaccination for travel.  She reports being up to date on HPV, hepatitis B, tetanus, and COVID-19 vaccines.  She had a normal Pap smear last year after a prior ASCUS result.  She is pursuing a master's degree in biomedicine and lives with supportive roommates who encourage healthy eating.   Diet: doing better, increasing protein intake and roommate's helping with cooking  Exercise: goes to gym about 4 times a week   Last Eye Exam: UTD Last Dental Exam: UTD  Flowsheet Row Office Visit from 04/19/2024 in [facility]  AUDIT-C Score 0   Depression: Phq 9 is  improved, stable on Effexor     04/19/2024    1:15 PM 03/14/2023   10:11 AM 02/14/2023    9:25 AM 10/07/2022    9:47 AM 08/15/2022   11:01 AM  Depression screen PHQ 2/9  Decreased Interest 1 1 1  0 3  Down, Depressed, Hopeless 1 1 1  0 2  PHQ - 2 Score 2 2 2  0 5  Altered sleeping 2 1 2  0 3  Tired, decreased energy 1 1 3  0 3  Change in appetite 2 1 3  0 3  Feeling bad or failure  about yourself  0 0 2 0 2  Trouble concentrating 2 0 2 0 2  Moving slowly or fidgety/restless 0 0 0 0 0  Suicidal thoughts 0 0 0 0 0  PHQ-9 Score 9 5  14   0  18   Difficult doing work/chores Somewhat difficult Very difficult Somewhat difficult Not difficult at all Somewhat difficult     Data saved with a previous flowsheet row definition   Hypertension: BP Readings from Last 3 Encounters:  04/19/24 122/78  05/20/23 120/82  03/14/23 118/74   Obesity: Wt Readings from Last 3 Encounters:  04/19/24 238 lb (108 kg)  05/20/23 248 lb 6.4 oz (112.7 kg)  03/14/23 261 lb 3.2 oz (118.5 kg)   BMI Readings from Last 3 Encounters:  04/19/24 44.97 kg/m  05/20/23 46.93 kg/m  03/14/23 49.35 kg/m     Vaccines:   HPV: completed Tdap: 10/02/17 Shingrix: N/a Pneumonia: N/a Flu: Here today COVID-19: 3 doses   Hep C Screening: complete STD testing and prevention (HIV/chl/gon/syphilis): screening due Intimate partner violence: negative screen  LMP: regular, 3 weeks - on OCP's and doing well  Discussed importance of follow up if any post-menopausal bleeding: not applicable  Incontinence Symptoms: negative for symptoms   Breast cancer:  - Last  Mammogram: Discussed screening at age 41  Osteoporosis Prevention : Discussed high calcium and vitamin D supplementation, weight bearing exercises Bone density :not applicable   Cervical cancer screening: UTD 11/24, repeat in 3 years  Skin cancer: Discussed monitoring for atypical lesions  Colorectal cancer: Discussed screening at age 64 Lung cancer:  Low Dose CT Chest recommended if Age 40-80 years, 20 pack-year currently smoking OR have quit w/in 15years. Patient does not qualify for screen     Advanced Care Planning: A voluntary discussion about advance care planning including the explanation and discussion of advance directives.  Discussed health care proxy and Living will, and the patient was able to identify a health care proxy as parents  - mother Kimberly Pickler or father Brian Piedra.  Patient does not have a living will and power of attorney of health care   Lipids: Lab Results  Component Value Date   CHOL 206 (H) 03/14/2023   Lab Results  Component Value Date   HDL 53 03/14/2023   Lab Results  Component Value Date   LDLCALC 134 (H) 03/14/2023   Lab Results  Component Value Date   TRIG 87 03/14/2023   Lab Results  Component Value Date   CHOLHDL 3.9 03/14/2023   No results found for: LDLDIRECT  Glucose: Glucose, Bld  Date Value Ref Range Status  03/14/2023 85 65 - 99 mg/dL Final    Comment:    .            Fasting reference interval .     Patient Active Problem List   Diagnosis Date Noted   Episode of recurrent major depressive disorder 02/14/2023   Hypothyroidism 09/12/2020    Past Surgical History:  Procedure Laterality Date   CHOLECYSTECTOMY  November 24 2020   NO PAST SURGERIES      Family History  Problem Relation Age of Onset   Lung cancer Maternal Grandfather    Cancer Maternal Grandfather    Breast cancer Paternal Grandmother 17       Malignant neoplasms, Estrogen Receptor positive   Asthma Paternal Grandmother    Cancer Paternal Grandmother    Heart disease Paternal Grandmother    Miscarriages / Stillbirths Paternal Grandmother    Obesity Paternal Grandmother    Hypertension Father    Obesity Father    Obesity Paternal Grandfather    Asthma Brother    Obesity Brother    Depression Brother    Obesity Brother    Obesity Paternal Uncle     Social History   Socioeconomic History   Marital status: Single    Spouse name: Not on file   Number of children: Not on file   Years of education: Not on file   Highest education level: Not on file  Occupational History   Not on file  Tobacco Use   Smoking status: Never   Smokeless tobacco: Never  Vaping Use   Vaping status: Never Used  Substance and Sexual Activity   Alcohol use: No   Drug use: Yes    Frequency: 3.0 times  per week    Comment: Delta 8   Sexual activity: Yes    Birth control/protection: Condom, Pill  Other Topics Concern   Not on file  Social History Narrative   Not on file   Social Drivers of Health   Tobacco Use: Low Risk (04/19/2024)   Patient History    Smoking Tobacco Use: Never    Smokeless Tobacco Use: Never    Passive Exposure:  Not on file  Financial Resource Strain: Low Risk (04/19/2024)   Overall Financial Resource Strain (CARDIA)    Difficulty of Paying Living Expenses: Not hard at all  Food Insecurity: No Food Insecurity (04/19/2024)   Epic    Worried About Programme Researcher, Broadcasting/film/video in the Last Year: Never true    Ran Out of Food in the Last Year: Never true  Transportation Needs: No Transportation Needs (04/19/2024)   Epic    Lack of Transportation (Medical): No    Lack of Transportation (Non-Medical): No  Physical Activity: Sufficiently Active (04/19/2024)   Exercise Vital Sign    Days of Exercise per Week: 4 days    Minutes of Exercise per Session: 60 min  Stress: Stress Concern Present (04/19/2024)   [employer] of Occupational Health - Occupational Stress Questionnaire    Feeling of Stress: To some extent  Social Connections: Moderately Isolated (04/19/2024)   Social Connection and Isolation Panel    Frequency of Communication with Friends and Family: More than three times a week    Frequency of Social Gatherings with Friends and Family: More than three times a week    Attends Religious Services: More than 4 times per year    Active Member of Clubs or Organizations: No    Attends Banker Meetings: Never    Marital Status: Never married  Intimate Partner Violence: Not At Risk (04/19/2024)   Epic    Fear of Current or Ex-Partner: No    Emotionally Abused: No    Physically Abused: No    Sexually Abused: No  Depression (PHQ2-9): Medium Risk (04/19/2024)   Depression (PHQ2-9)    PHQ-2 Score: 9  Alcohol Screen: Low Risk (04/19/2024)   Alcohol  Screen    Last Alcohol Screening Score (AUDIT): 0  Housing: Unknown (04/19/2024)   Epic    Unable to Pay for Housing in the Last Year: No    Number of Times Moved in the Last Year: Not on file    Homeless in the Last Year: No  Utilities: Not At Risk (04/19/2024)   Epic    Threatened with loss of utilities: No  Health Literacy: Adequate Health Literacy (04/19/2024)   B1300 Health Literacy    Frequency of need for help with medical instructions: Never     Current Outpatient Medications:    Levonorgest-Eth Est & Eth Est 42-21-21-7 DAYS TABS, Take 1 tablet by mouth daily., Disp: 91 tablet, Rfl: 0   venlafaxine  XR (EFFEXOR -XR) 150 MG 24 hr capsule, Take 1 capsule (150 mg total) by mouth daily., Disp: 30 capsule, Rfl: 0  Allergies  Allergen Reactions   Latex Itching and Swelling     Review of Systems  All other systems reviewed and are negative.    Objective  Vitals:   04/19/24 1310  BP: 122/78  Pulse: 100  Resp: 18  Temp: 98 F (36.7 C)  SpO2: 96%  Weight: 238 lb (108 kg)  Height: 5' 1 (1.549 m)    Body mass index is 44.97 kg/m.  Physical Exam Exam conducted with a chaperone present.  Constitutional:      Appearance: Normal appearance.  HENT:     Head: Normocephalic and atraumatic.     Mouth/Throat:     Mouth: Mucous membranes are moist.     Pharynx: Oropharynx is clear.  Eyes:     Extraocular Movements: Extraocular movements intact.     Conjunctiva/sclera: Conjunctivae normal.     Pupils: Pupils are equal, round, and  reactive to light.  Neck:     Comments: No thyromegaly  Cardiovascular:     Rate and Rhythm: Normal rate and regular rhythm.  Pulmonary:     Effort: Pulmonary effort is normal.     Breath sounds: Normal breath sounds.  Chest:  Breasts:    Right: Normal.     Left: Normal.  Genitourinary:    Comments: External genitalia within normal limits.  Vaginal mucosa pink, moist, normal rugae.  Nonfriable cervix without lesions, no discharge or  bleeding noted on speculum exam.   Musculoskeletal:     Cervical back: No tenderness.     Right lower leg: No edema.     Left lower leg: No edema.  Lymphadenopathy:     Cervical: No cervical adenopathy.     Upper Body:     Right upper body: No supraclavicular, axillary or pectoral adenopathy.     Left upper body: No supraclavicular, axillary or pectoral adenopathy.  Skin:    General: Skin is warm and dry.  Neurological:     General: No focal deficit present.     Mental Status: She is alert. Mental status is at baseline.  Psychiatric:        Mood and Affect: Mood normal.        Behavior: Behavior normal.     No results found for this or any previous visit (from the past 2160 hours).   Fall Risk:    04/19/2024    1:14 PM 05/20/2023    1:34 PM 03/14/2023   10:10 AM 02/14/2023    9:23 AM 10/07/2022    9:36 AM  Fall Risk   Falls in the past year? 0 0 0 0 0  Number falls in past yr: 0 0 0 0 0  Injury with Fall? 0 0  0  0  0   Risk for fall due to :  No Fall Risks No Fall Risks    Follow up Falls evaluation completed Falls evaluation completed Falls prevention discussed;Education provided;Falls evaluation completed       Data saved with a previous flowsheet row definition    Functional Status Survey:     Assessment & Plan  Assessment & Plan Woman's Wellness Visit Annual wellness visit with significant weight loss of 40 pounds, current weight 238 pounds, and BMI reduction. Blood pressure well-controlled. Vaccinations up to date. Normal Pap smear last year after previous ASCUS result. - Ordered labs for kidney function, liver function, electrolytes, anemia, and cholesterol. - Advised hepatitis A vaccination before travel to [place]. - Scheduled next Pap smear in two years.  Major depressive disorder, recurrent Recurrent major depressive disorder managed with venlafaxine  150 mg daily. She reports satisfaction with current dosage and effectiveness. - Refilled venlafaxine   150 mg prescription.  Contraceptive management (oral) Oral contraceptive management with current 91-day pack regimen. No issues reported. - Refilled oral contraceptive 91-day pack.  - Cervicovaginal ancillary only - Flu vaccine trivalent PF, 6mos and older(Flulaval,Afluria,Fluarix,Fluzone) - venlafaxine  XR (EFFEXOR -XR) 150 MG 24 hr capsule; Take 1 capsule (150 mg total) by mouth daily.  Dispense: 90 capsule; Refill: 3 - Levonorgest-Eth Est & Eth Est 42-21-21-7 DAYS TABS; Take 1 tablet by mouth daily.  Dispense: 91 tablet; Refill: 2 - CBC w/Diff/Platelet - Comprehensive Metabolic Panel (CMET) - Lipid Profile   -USPSTF grade A and B recommendations reviewed with patient; age-appropriate recommendations, preventive care, screening tests, etc discussed and encouraged; healthy living encouraged; see AVS for patient education given to patient -Discussed importance of 150  minutes of physical activity weekly, eat two servings of fish weekly, eat one serving of tree nuts ( cashews, pistachios, pecans, almonds.SABRA) every other day, eat 6 servings of fruit/vegetables daily and drink plenty of water and avoid sweet beverages.   -Reviewed Health Maintenance: Yes.

## 2024-04-20 ENCOUNTER — Ambulatory Visit: Payer: Self-pay | Admitting: Internal Medicine

## 2024-04-20 LAB — COMPREHENSIVE METABOLIC PANEL WITH GFR
AG Ratio: 1.3 (calc) (ref 1.0–2.5)
ALT: 16 U/L (ref 6–29)
AST: 13 U/L (ref 10–30)
Albumin: 4.2 g/dL (ref 3.6–5.1)
Alkaline phosphatase (APISO): 67 U/L (ref 31–125)
BUN: 12 mg/dL (ref 7–25)
CO2: 25 mmol/L (ref 20–32)
Calcium: 9.6 mg/dL (ref 8.6–10.2)
Chloride: 103 mmol/L (ref 98–110)
Creat: 0.67 mg/dL (ref 0.50–0.96)
Globulin: 3.2 g/dL (ref 1.9–3.7)
Glucose, Bld: 85 mg/dL (ref 65–99)
Potassium: 4.9 mmol/L (ref 3.5–5.3)
Sodium: 138 mmol/L (ref 135–146)
Total Bilirubin: 0.3 mg/dL (ref 0.2–1.2)
Total Protein: 7.4 g/dL (ref 6.1–8.1)
eGFR: 125 mL/min/1.73m2 (ref >=60)

## 2024-04-20 LAB — CBC WITH DIFFERENTIAL/PLATELET
Absolute Lymphocytes: 2980 {cells}/uL (ref 850–3900)
Absolute Monocytes: 393 {cells}/uL (ref 200–950)
Basophils Absolute: 69 {cells}/uL (ref 0–200)
Basophils Relative: 0.9 %
Eosinophils Absolute: 139 {cells}/uL (ref 15–500)
Eosinophils Relative: 1.8 %
HCT: 41.9 % (ref 35.9–46.0)
Hemoglobin: 13.5 g/dL (ref 11.7–15.5)
MCH: 28.6 pg (ref 27.0–33.0)
MCHC: 32.2 g/dL (ref 31.6–35.4)
MCV: 88.8 fL (ref 81.4–101.7)
MPV: 10.9 fL (ref 7.5–12.5)
Monocytes Relative: 5.1 %
Neutro Abs: 4120 {cells}/uL (ref 1500–7800)
Neutrophils Relative %: 53.5 %
Platelets: 433 Thousand/uL — ABNORMAL HIGH (ref 140–400)
RBC: 4.72 Million/uL (ref 3.80–5.10)
RDW: 12.6 % (ref 11.0–15.0)
Total Lymphocyte: 38.7 %
WBC: 7.7 Thousand/uL (ref 3.8–10.8)

## 2024-04-20 LAB — LIPID PANEL
Cholesterol: 171 mg/dL (ref <200)
HDL: 54 mg/dL (ref >=50)
LDL Cholesterol (Calc): 103 mg/dL — ABNORMAL HIGH
Non-HDL Cholesterol (Calc): 117 mg/dL (ref <130)
Total CHOL/HDL Ratio: 3.2 (calc) (ref <5.0)
Triglycerides: 49 mg/dL (ref <150)

## 2024-04-21 LAB — CERVICOVAGINAL ANCILLARY ONLY
Bacterial Vaginitis (gardnerella): NEGATIVE
Candida Glabrata: NEGATIVE
Candida Vaginitis: POSITIVE — AB
Chlamydia: NEGATIVE
Comment: NEGATIVE
Comment: NEGATIVE
Comment: NEGATIVE
Comment: NEGATIVE
Comment: NEGATIVE
Comment: NORMAL
Neisseria Gonorrhea: NEGATIVE
Trichomonas: NEGATIVE

## 2025-04-22 ENCOUNTER — Encounter: Admitting: Internal Medicine
